# Patient Record
Sex: Female | Born: 1970 | Race: White | Hispanic: No | Marital: Married | Smoking: Current every day smoker
Health system: Southern US, Community
[De-identification: ages and names within clinical notes are randomized; demographics above are authoritative.]

## PROBLEM LIST (undated history)

## (undated) DIAGNOSIS — Z923 Personal history of irradiation: Secondary | ICD-10-CM

## (undated) DIAGNOSIS — T7840XA Allergy, unspecified, initial encounter: Secondary | ICD-10-CM

## (undated) DIAGNOSIS — I10 Essential (primary) hypertension: Secondary | ICD-10-CM

## (undated) DIAGNOSIS — F419 Anxiety disorder, unspecified: Secondary | ICD-10-CM

## (undated) HISTORY — DX: Anxiety disorder, unspecified: F41.9

## (undated) HISTORY — DX: Allergy, unspecified, initial encounter: T78.40XA

## (undated) HISTORY — PX: WISDOM TOOTH EXTRACTION: SHX21

## (undated) HISTORY — PX: COLONOSCOPY: SHX174

---

## 2005-10-22 ENCOUNTER — Ambulatory Visit: Payer: Self-pay | Admitting: Pediatrics

## 2007-03-25 ENCOUNTER — Ambulatory Visit: Payer: Self-pay | Admitting: Family Medicine

## 2008-10-18 ENCOUNTER — Ambulatory Visit: Payer: Self-pay | Admitting: Family Medicine

## 2008-11-02 ENCOUNTER — Ambulatory Visit: Payer: Self-pay | Admitting: Family Medicine

## 2009-04-18 ENCOUNTER — Ambulatory Visit: Payer: Self-pay | Admitting: Family Medicine

## 2011-01-02 ENCOUNTER — Ambulatory Visit: Payer: Self-pay | Admitting: Family Medicine

## 2013-08-10 ENCOUNTER — Ambulatory Visit: Payer: Self-pay | Admitting: Physician Assistant

## 2013-09-10 ENCOUNTER — Ambulatory Visit: Payer: Self-pay | Admitting: Gastroenterology

## 2014-08-09 DIAGNOSIS — E663 Overweight: Secondary | ICD-10-CM | POA: Insufficient documentation

## 2014-08-09 DIAGNOSIS — F419 Anxiety disorder, unspecified: Secondary | ICD-10-CM | POA: Insufficient documentation

## 2016-01-15 ENCOUNTER — Other Ambulatory Visit: Payer: Self-pay | Admitting: Adult Health

## 2016-01-15 DIAGNOSIS — Z1231 Encounter for screening mammogram for malignant neoplasm of breast: Secondary | ICD-10-CM

## 2016-02-08 ENCOUNTER — Ambulatory Visit: Payer: Self-pay

## 2018-01-21 DIAGNOSIS — J309 Allergic rhinitis, unspecified: Secondary | ICD-10-CM | POA: Insufficient documentation

## 2020-03-21 ENCOUNTER — Other Ambulatory Visit: Payer: Self-pay | Admitting: Student

## 2020-03-21 DIAGNOSIS — Z1231 Encounter for screening mammogram for malignant neoplasm of breast: Secondary | ICD-10-CM

## 2020-03-24 ENCOUNTER — Other Ambulatory Visit: Payer: Self-pay

## 2020-03-24 ENCOUNTER — Ambulatory Visit
Admission: RE | Admit: 2020-03-24 | Discharge: 2020-03-24 | Disposition: A | Discharge status: Home / Self Care | Payer: 59 | Source: Ambulatory Visit | Attending: Student | Admitting: Student

## 2020-03-24 DIAGNOSIS — Z1231 Encounter for screening mammogram for malignant neoplasm of breast: Secondary | ICD-10-CM | POA: Insufficient documentation

## 2020-03-28 ENCOUNTER — Inpatient Hospital Stay
Admission: RE | Admit: 2020-03-28 | Discharge: 2020-03-28 | Disposition: A | Discharge status: Home / Self Care | Payer: Self-pay | Source: Ambulatory Visit | Attending: *Deleted | Admitting: *Deleted

## 2020-03-28 ENCOUNTER — Other Ambulatory Visit: Payer: Self-pay | Admitting: *Deleted

## 2020-03-28 DIAGNOSIS — Z1231 Encounter for screening mammogram for malignant neoplasm of breast: Secondary | ICD-10-CM

## 2021-03-09 ENCOUNTER — Other Ambulatory Visit: Payer: Self-pay | Admitting: Student

## 2021-03-09 DIAGNOSIS — Z1231 Encounter for screening mammogram for malignant neoplasm of breast: Secondary | ICD-10-CM

## 2021-03-27 ENCOUNTER — Other Ambulatory Visit: Payer: Self-pay

## 2021-03-27 ENCOUNTER — Ambulatory Visit
Admission: RE | Admit: 2021-03-27 | Discharge: 2021-03-27 | Disposition: A | Discharge status: Home / Self Care | Payer: BC Managed Care – PPO | Source: Ambulatory Visit | Attending: Student | Admitting: Student

## 2021-03-27 DIAGNOSIS — Z1231 Encounter for screening mammogram for malignant neoplasm of breast: Secondary | ICD-10-CM | POA: Insufficient documentation

## 2022-03-08 ENCOUNTER — Other Ambulatory Visit: Payer: Self-pay | Admitting: Student

## 2022-03-08 DIAGNOSIS — Z1231 Encounter for screening mammogram for malignant neoplasm of breast: Secondary | ICD-10-CM

## 2022-03-29 ENCOUNTER — Ambulatory Visit
Admission: RE | Admit: 2022-03-29 | Discharge: 2022-03-29 | Disposition: A | Discharge status: Home / Self Care | Payer: BC Managed Care – PPO | Source: Ambulatory Visit | Attending: Student | Admitting: Student

## 2022-03-29 DIAGNOSIS — Z1231 Encounter for screening mammogram for malignant neoplasm of breast: Secondary | ICD-10-CM | POA: Insufficient documentation

## 2022-04-02 ENCOUNTER — Other Ambulatory Visit: Payer: Self-pay | Admitting: Student

## 2022-04-02 DIAGNOSIS — R928 Other abnormal and inconclusive findings on diagnostic imaging of breast: Secondary | ICD-10-CM

## 2022-04-02 DIAGNOSIS — N63 Unspecified lump in unspecified breast: Secondary | ICD-10-CM

## 2022-04-23 ENCOUNTER — Ambulatory Visit
Admission: RE | Admit: 2022-04-23 | Discharge: 2022-04-23 | Disposition: A | Discharge status: Home / Self Care | Payer: BC Managed Care – PPO | Source: Ambulatory Visit | Attending: Student | Admitting: Student

## 2022-04-23 DIAGNOSIS — N63 Unspecified lump in unspecified breast: Secondary | ICD-10-CM

## 2022-04-23 DIAGNOSIS — R928 Other abnormal and inconclusive findings on diagnostic imaging of breast: Secondary | ICD-10-CM

## 2022-04-25 ENCOUNTER — Other Ambulatory Visit: Payer: Self-pay | Admitting: Student

## 2022-04-25 DIAGNOSIS — R928 Other abnormal and inconclusive findings on diagnostic imaging of breast: Secondary | ICD-10-CM

## 2022-04-25 DIAGNOSIS — N63 Unspecified lump in unspecified breast: Secondary | ICD-10-CM

## 2022-05-02 ENCOUNTER — Ambulatory Visit
Admission: RE | Admit: 2022-05-02 | Discharge: 2022-05-02 | Disposition: A | Discharge status: Home / Self Care | Payer: BC Managed Care – PPO | Source: Ambulatory Visit | Attending: Student | Admitting: Student

## 2022-05-02 DIAGNOSIS — N63 Unspecified lump in unspecified breast: Secondary | ICD-10-CM

## 2022-05-02 DIAGNOSIS — R928 Other abnormal and inconclusive findings on diagnostic imaging of breast: Secondary | ICD-10-CM

## 2022-05-02 HISTORY — PX: BREAST BIOPSY: SHX20

## 2022-05-02 MED ORDER — LIDOCAINE HCL (PF) 1 % IJ SOLN
3.0000 mL | Freq: Once | INTRAMUSCULAR | Status: AC
  Administered 2022-05-02: 3 mL

## 2022-05-02 MED ORDER — LIDOCAINE-EPINEPHRINE 1 %-1:100000 IJ SOLN
8.0000 mL | Freq: Once | INTRAMUSCULAR | Status: AC
  Administered 2022-05-02: 8 mL

## 2022-05-03 ENCOUNTER — Encounter: Payer: Self-pay | Admitting: *Deleted

## 2022-05-03 NOTE — Progress Notes (Signed)
Received referral for newly diagnosed breast cancer from [facility].  Navigation initiated.  Gave Ms. Rhonda Charles names of surgeons and med oncs, she is going to decide who she would like to see and give me a call on Monday.

## 2022-05-06 ENCOUNTER — Encounter: Payer: Self-pay | Admitting: *Deleted

## 2022-05-06 DIAGNOSIS — C50919 Malignant neoplasm of unspecified site of unspecified female breast: Secondary | ICD-10-CM

## 2022-05-06 LAB — SURGICAL PATHOLOGY

## 2022-05-06 NOTE — Progress Notes (Signed)
Rhonda Charles would like to see Dr. Grayland Ormond and Dr. Juliet Rude at [facility] breast clinic for surgery.   Referral faxed to [facility].   She will see Dr. Grayland Ormond tomorrow at 1:30.

## 2022-05-07 ENCOUNTER — Inpatient Hospital Stay: Payer: BC Managed Care – PPO

## 2022-05-07 ENCOUNTER — Inpatient Hospital Stay: Payer: BC Managed Care – PPO | Attending: Oncology | Admitting: Oncology

## 2022-05-07 ENCOUNTER — Encounter: Payer: Self-pay | Admitting: *Deleted

## 2022-05-07 ENCOUNTER — Encounter: Payer: Self-pay | Admitting: Oncology

## 2022-05-07 VITALS — BP 177/88 | HR 90 | Temp 98.6°F | Resp 16 | Ht 63.0 in | Wt 168.0 lb

## 2022-05-07 DIAGNOSIS — Z79899 Other long term (current) drug therapy: Secondary | ICD-10-CM | POA: Diagnosis not present

## 2022-05-07 DIAGNOSIS — Z170 Estrogen receptor positive status [ER+]: Secondary | ICD-10-CM | POA: Diagnosis not present

## 2022-05-07 DIAGNOSIS — Z803 Family history of malignant neoplasm of breast: Secondary | ICD-10-CM | POA: Diagnosis not present

## 2022-05-07 DIAGNOSIS — F17210 Nicotine dependence, cigarettes, uncomplicated: Secondary | ICD-10-CM | POA: Diagnosis not present

## 2022-05-07 DIAGNOSIS — C50411 Malignant neoplasm of upper-outer quadrant of right female breast: Secondary | ICD-10-CM | POA: Insufficient documentation

## 2022-05-07 DIAGNOSIS — C50911 Malignant neoplasm of unspecified site of right female breast: Secondary | ICD-10-CM | POA: Insufficient documentation

## 2022-05-07 NOTE — Progress Notes (Signed)
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  INTERVAL HISTORY: Patient is a 52 year old female who was noted to have an abnormality on her right breast during routine screening mammogram.  Subsequent ultrasound and biopsy revealed the above-stated malignancy.  She currently feels well and is asymptomatic.  She has no neurologic complaints.  She denies any recent fevers or illnesses.  She has a good appetite and denies weight loss.  She has no chest pain, shortness of breath, cough, or hemoptysis.  She denies any nausea, vomiting, constipation, or diarrhea.  She has no urinary complaints.  Patient feels at her baseline and offers no specific complaints today.  REVIEW OF SYSTEMS:   Review of Systems  Constitutional: Negative.  Negative for fever, malaise/fatigue and weight loss.  Respiratory: Negative.  Negative for cough, hemoptysis and shortness of breath.   Cardiovascular: Negative.  Negative for chest pain and leg swelling.  Gastrointestinal: Negative.  Negative for abdominal pain.  Genitourinary: Negative.  Negative for dysuria.  Musculoskeletal: Negative.  Negative for back pain.  Skin: Negative.  Negative for rash.  Neurological: Negative.  Negative for dizziness, focal weakness, weakness and headaches.  Psychiatric/Behavioral: Negative.  The patient is not nervous/anxious.     As per HPI. Otherwise, a complete review of systems is negative.  PAST MEDICAL HISTORY: Past Medical History:  Diagnosis Date   Allergy    Seasonal   Anxiety     PAST SURGICAL HISTORY: Past Surgical History:  Procedure Laterality Date   BREAST BIOPSY Right 05/02/2022    US biopsy/ ribbon clip/ path pending   BREAST BIOPSY Right 05/02/2022   [place] RT BREAST BX W LOC DEV 1ST LESION IMG BX SPEC US GUIDE 05/02/2022 [facility]   WISDOM TOOTH EXTRACTION Bilateral     FAMILY HISTORY: Family History  Problem Relation Age of Onset   Breast cancer Mother 80   Anxiety disorder Mother    Arthritis Mother    Cancer Mother    Diabetes Mother    Breast cancer Maternal Grandmother 93   Cancer Maternal Grandmother    Varicose Veins Maternal Grandmother     ADVANCED DIRECTIVES (Y/N):  N  HEALTH MAINTENANCE: Social History   Tobacco Use   Smoking status: Every Day    Packs/day: 0.00    Years: 15.00    Total pack years: 0.00    Types: Cigarettes   Tobacco comments:    In a program  Substance Use Topics   Alcohol use: Not Currently    Alcohol/week: 2.0 standard drinks of alcohol    Types: 2 Cans of beer per week   Drug use: Never     Colonoscopy:  PAP:  Bone density:  Lipid panel:  No Known Allergies  Current Outpatient Medications  Medication Sig Dispense Refill   escitalopram (LEXAPRO) 10 MG tablet Take 10 mg by mouth daily.     montelukast (SINGULAIR) 10 MG tablet Take 1 tablet by mouth at bedtime.     Norgestimate-Ethinyl Estradiol Triphasic 0.18/0.215/0.25 MG-35 MCG tablet Take 1 tablet by mouth daily.     No current facility-administered medications for this visit.    OBJECTIVE: Vitals:   05/07/22 1338  BP: (!) 177/88  Pulse: 90  Resp: 16  Temp: 98.6 F (37 C)  SpO2: 98%     Body mass index is 29.76 kg/m.    ECOG FS:0 - Asymptomatic  General: Well-developed, well-nourished, no acute distress. Eyes: Pink conjunctiva, anicteric sclera. HEENT: Normocephalic, moist mucous membranes. Breast: Exam deferred today. Lungs: No audible wheezing or coughing. Heart: Regular rate and rhythm. Abdomen: Soft, nontender, no obvious distention. Musculoskeletal: No edema, cyanosis, or clubbing. Neuro: Alert, answering all questions  appropriately. Cranial nerves grossly intact. Skin: No rashes or petechiae noted. Psych: Normal affect. Lymphatics: No cervical, calvicular, axillary or inguinal LAD.   LAB RESULTS:  No results found for: "NA", "K", "CL", "CO2", "GLUCOSE", "BUN", "CREATININE", "CALCIUM", "PROT", "ALBUMIN", "AST", "ALT", "ALKPHOS", "BILITOT", "GFRNONAA", "GFRAA"  No results found for: "WBC", "NEUTROABS", "HGB", "HCT", "MCV", "PLT"   STUDIES: [place] RT BREAST BX W LOC DEV 1ST LESION IMG BX SPEC US GUIDE  Addendum Date: 05/03/2022   ADDENDUM REPORT: 05/03/2022 14:51 ADDENDUM: PATHOLOGY revealed: A. BREAST, RIGHT, 11:00, 8 CM FROM NIPPLE; CORE BIOPSIES: - INVASIVE MAMMARY CARCINOMA, NO SPECIAL TYPE. 5 mm in this sample. Grade 1 (of 3). 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 Lymphovascular invasion: Not identified. Pathology results are CONCORDANT with imaging findings, per Dr. Beryle Flock. Recommend surgical and oncologic consultation. Pathology results and recommendations were discussed with patient via telephone on 05/03/2022. Patient reported biopsy site doing well with no adverse symptoms, and only slight tenderness at the site. Post biopsy care instructions were reviewed, questions were answered and my direct phone number was provided. Patient was instructed to call [facility] for any additional questions or concerns related to biopsy site. RECOMMENDATIONS: Surgical and oncological consultation. Request for surgical and oncological consultation relayed to Casper Harrison RN at [facility] by Electa Sniff RN on 05/03/2022. Pathology results reported by Electa Sniff RN on 05/03/2022. Electronically Signed   By: Beryle Flock M.D.   On: 05/03/2022 14:51   Result Date: 05/03/2022 CLINICAL DATA:  Suspicious mass in the right breast 11 o'clock position EXAM: ULTRASOUND GUIDED RIGHT BREAST CORE NEEDLE BIOPSY COMPARISON:  Previous exam(s). PROCEDURE: I met with the patient  and we discussed the procedure of ultrasound-guided biopsy, including benefits and alternatives. We discussed the high likelihood of a successful procedure. We discussed the risks of the procedure, including infection, bleeding, tissue injury, clip migration, and inadequate sampling. Informed written consent was given. The usual time-out protocol was performed immediately prior to the procedure. Lesion quadrant: Upper outer quadrant Using sterile technique and 1% Lidocaine as local anesthetic, under direct ultrasound visualization, a 14 gauge spring-loaded device was used to perform biopsy of a mass in the right breast 11 o'clock position using an inferior approach. At the conclusion of the procedure, a ribbon shaped tissue marker clip was deployed into the biopsy cavity. Follow up 2 view mammogram was performed and dictated separately. IMPRESSION: Ultrasound guided biopsy of a mass in the right breast 11 o'clock position. No apparent complications. Electronically Signed: By: Beryle Flock M.D. On: 05/02/2022 13:51   MM CLIP PLACEMENT RIGHT  Result Date: 05/02/2022 CLINICAL DATA:  Post procedure mammogram EXAM: 3D DIAGNOSTIC RIGHT MAMMOGRAM POST ULTRASOUND BIOPSY COMPARISON:  Previous exam(s). FINDINGS: 3D Mammographic images were obtained following ultrasound guided biopsy of a mass in the right breast 11 o'clock position. The biopsy marking clip is in expected position at the site of biopsy, along the medial/posterior margin of the mass. IMPRESSION: Appropriate positioning of the ribbon shaped biopsy marking clip at  the site of biopsy in the right breast 11 o'clock position. Final Assessment: Post Procedure Mammograms for Marker Placement Electronically Signed   By: Beryle Flock M.D.   On: 05/02/2022 13:57  MM DIAG BREAST TOMO UNI RIGHT  Result Date: 04/23/2022 CLINICAL DATA:  Screening recall for a possible right breast mass. The patient has family history of breast cancer in her mother and maternal  grandmother. EXAM: DIGITAL DIAGNOSTIC UNILATERAL RIGHT MAMMOGRAM WITH TOMOSYNTHESIS; ULTRASOUND RIGHT BREAST LIMITED TECHNIQUE: Right digital diagnostic mammography and breast tomosynthesis was performed.; Targeted ultrasound examination of the right breast was performed COMPARISON:  Previous exam(s). ACR Breast Density Category b: There are scattered areas of fibroglandular density. FINDINGS: Spot compression tomosynthesis images through the superior posterior right breast demonstrates a small irregular mass with spiculated margins. Ultrasound of the right breast at 11 o'clock, 8 cm from the nipple demonstrates a hypoechoic oval mass with indistinct margins measuring 5 x 4 x 5 mm. Ultrasound of the right axilla demonstrates multiple normal-appearing lymph nodes. IMPRESSION: 1. There is a suspicious 5 mm mass in the right breast at 11 o'clock. 2.  No evidence of right axillary lymphadenopathy. RECOMMENDATION: 1. Ultrasound guided biopsy is recommended for the right breast mass. We will contact the patient to schedule the procedure at her earliest convenience. 2. Genetics assessment suggested to determine the patient's lifetime risk of breast cancer given her family history, if this has not already been performed. Per American Cancer Society guidelines, if the patient has a calculated lifetime risk of developing breast cancer of greater than 20%, bilateral breast MRI would be recommended. I have discussed the findings and recommendations with the patient. If applicable, a reminder letter will be sent to the patient regarding the next appointment. BI-RADS CATEGORY  5: Highly suggestive of malignancy. Electronically Signed   By: Ammie Ferrier M.D.   On: 04/23/2022 12:05  US BREAST LTD UNI RIGHT INC AXILLA  Result Date: 04/23/2022 CLINICAL DATA:  Screening recall for a possible right breast mass. The patient has family history of breast cancer in her mother and maternal grandmother. EXAM: DIGITAL DIAGNOSTIC  UNILATERAL RIGHT MAMMOGRAM WITH TOMOSYNTHESIS; ULTRASOUND RIGHT BREAST LIMITED TECHNIQUE: Right digital diagnostic mammography and breast tomosynthesis was performed.; Targeted ultrasound examination of the right breast was performed COMPARISON:  Previous exam(s). ACR Breast Density Category b: There are scattered areas of fibroglandular density. FINDINGS: Spot compression tomosynthesis images through the superior posterior right breast demonstrates a small irregular mass with spiculated margins. Ultrasound of the right breast at 11 o'clock, 8 cm from the nipple demonstrates a hypoechoic oval mass with indistinct margins measuring 5 x 4 x 5 mm. Ultrasound of the right axilla demonstrates multiple normal-appearing lymph nodes. IMPRESSION: 1. There is a suspicious 5 mm mass in the right breast at 11 o'clock. 2.  No evidence of right axillary lymphadenopathy. RECOMMENDATION: 1. Ultrasound guided biopsy is recommended for the right breast mass. We will contact the patient to schedule the procedure at her earliest convenience. 2. Genetics assessment suggested to determine the patient's lifetime risk of breast cancer given her family history, if this has not already been performed. Per American Cancer Society guidelines, if the patient has a calculated lifetime risk of developing breast cancer of greater than 20%, bilateral breast MRI would be recommended. I have discussed the findings and recommendations with the patient. If applicable, a reminder letter will be sent to the patient regarding the next appointment. BI-RADS CATEGORY  5: Highly suggestive of malignancy. Electronically Signed  By: Ammie Ferrier M.D.   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  Given the small size of patient's  malignancy and stage, she will benefit from lumpectomy as first-line treatment.  Patient is considering having her surgery done at [facility].  She will unlikely require chemotherapy or even an Oncotype score, but will await final pathology to make this determination.  She will likely need adjuvant XRT.  Finally, patient will benefit from 5 years of either tamoxifen or letrozole at the conclusion of all her treatments.  Return to clinic 2 weeks after her surgery to discuss her final pathology results and additional treatment planning.  I spent a total of 60 minutes reviewing chart data, face-to-face evaluation with the patient, counseling and coordination of care as detailed above.   Patient expressed understanding and was in agreement with this plan. She also understands that She can call clinic at any time with any questions, concerns, or complaints.    Cancer Staging  Invasive ductal carcinoma of right breast [facility]) Staging form: Breast, AJCC 8th Edition - Clinical stage from 05/07/2022: Stage IA (cT1a, cN0, cM0, G1, ER+, PR+, HER2-) - Signed by Lloyd Huger, MD on 05/07/2022 Stage prefix: Initial diagnosis Histologic grading system: 3 grade system   Lloyd Huger, MD   05/07/2022 3:35 PM

## 2022-05-07 NOTE — Progress Notes (Signed)
Met with patient and husband during initial med onc appt. With Dr. Grayland Ormond. Reviewed Breast Cancer treatment handbook.   Care plan summary given to patient.   Reviewed outreach programs and cancer center services.

## 2022-05-17 ENCOUNTER — Encounter: Payer: Self-pay | Admitting: *Deleted

## 2022-05-17 NOTE — Progress Notes (Signed)
Rhonda Charles called to ask about her birth control pills.   Per Dr. Grayland Ormond she can switch to progestin only pill.   She will call her PCP and have a Rx sent in.

## 2022-05-22 ENCOUNTER — Other Ambulatory Visit: Payer: Self-pay | Admitting: General Surgery

## 2022-05-22 DIAGNOSIS — C50911 Malignant neoplasm of unspecified site of right female breast: Secondary | ICD-10-CM

## 2022-05-23 ENCOUNTER — Ambulatory Visit: Payer: BC Managed Care – PPO | Attending: General Surgery | Admitting: Physical Therapy

## 2022-05-23 ENCOUNTER — Encounter: Payer: Self-pay | Admitting: Physical Therapy

## 2022-05-23 ENCOUNTER — Other Ambulatory Visit: Payer: Self-pay

## 2022-05-23 ENCOUNTER — Ambulatory Visit: Payer: BC Managed Care – PPO | Admitting: Rehabilitation

## 2022-05-23 DIAGNOSIS — C50411 Malignant neoplasm of upper-outer quadrant of right female breast: Secondary | ICD-10-CM | POA: Insufficient documentation

## 2022-05-23 DIAGNOSIS — R293 Abnormal posture: Secondary | ICD-10-CM | POA: Diagnosis present

## 2022-05-23 DIAGNOSIS — Z170 Estrogen receptor positive status [ER+]: Secondary | ICD-10-CM | POA: Diagnosis present

## 2022-05-23 NOTE — Therapy (Signed)
OUTPATIENT PHYSICAL THERAPY BREAST CANCER BASELINE EVALUATION   Patient Name: Rhonda Charles MRN: [id number] DOB:09/24/1970, 52 y.o., female Today's Date: 05/23/2022  END OF SESSION:  PT End of Session - 05/23/22 1548     Visit Number 1    Number of Visits 2    Date for PT Re-Evaluation 07/04/22    PT Start Time 1505    PT Stop Time 1545    PT Time Calculation (min) 40 min    Activity Tolerance Patient tolerated treatment well    Behavior During Therapy [facility] for tasks assessed/performed             Past Medical History:  Diagnosis Date   Allergy    Seasonal   Anxiety    Past Surgical History:  Procedure Laterality Date   BREAST BIOPSY Right 05/02/2022   US biopsy/ ribbon clip/ path pending   BREAST BIOPSY Right 05/02/2022   [place] RT BREAST BX W LOC DEV 1ST LESION IMG BX Julian US GUIDE 05/02/2022 [facility]   WISDOM TOOTH EXTRACTION Bilateral    Patient Active Problem List   Diagnosis Date Noted   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[place]) 05/07/2022   Allergic rhinitis 01/21/2018   Anxiety 08/09/2014   Overweight (BMI 25.0-29.9) 08/09/2014    PCP: Wayland Denis PA-C  REFERRING PROVIDER: Rolm Bookbinder, MD  REFERRING DIAG: [phone] (ICD-10-CM) - Malignant neoplasm of upper-outer quadrant of right female breast ([place])  THERAPY DIAG:  Abnormal posture  Malignant neoplasm of upper-outer quadrant of right breast in female, estrogen receptor positive ([place])  Rationale for Evaluation and Treatment: Rehabilitation  ONSET DATE: 05/02/22  SUBJECTIVE:                                                                                                                                                                                           SUBJECTIVE STATEMENT: Patient reports she is here today to be seen by her medical team for her newly diagnosed right breast cancer.   PERTINENT HISTORY:  Patient was diagnosed on 05/02/22 with right grade 1. It measures 5 x  4 x 5 mm and is located in the upper-outer quadrant. It is ER/PR+, Her2-. Ki67 not completed. Marland Kitchen   PATIENT GOALS:   reduce lymphedema risk and learn post op HEP.   PAIN:  Are you having pain? No  PRECAUTIONS: Active CA   HAND DOMINANCE: left  WEIGHT BEARING RESTRICTIONS: No  FALLS:  Has patient fallen in last 6 months? No  LIVING ENVIRONMENT: Patient lives with: husband and son (74) Lives in: House/apartment Has following equipment at home: None  OCCUPATION: full time - office  manager  LEISURE: she walks 30 min 3x/wk, used to go to the gym but has not been in 6 months  PRIOR LEVEL OF FUNCTION: Independent   OBJECTIVE:  COGNITION: Overall cognitive status: Within functional limits for tasks assessed    POSTURE:  Forward head and rounded shoulders posture  UPPER EXTREMITY AROM/PROM:  A/PROM RIGHT   eval   Shoulder extension 63  Shoulder flexion 168  Shoulder abduction 173  Shoulder internal rotation 38  Shoulder external rotation 83    (Blank rows = not tested)  A/PROM LEFT   eval  Shoulder extension 54  Shoulder flexion 166  Shoulder abduction 175  Shoulder internal rotation 38  Shoulder external rotation 82    (Blank rows = not tested)  CERVICAL AROM: All within normal limits:    Percent limited  Flexion WFL  Extension WFL  Right lateral flexion WFL  Left lateral flexion WFL  Right rotation WFL  Left rotation WFL    UPPER EXTREMITY STRENGTH: 5/5  LYMPHEDEMA ASSESSMENTS:   LANDMARK RIGHT   eval  10 cm proximal to olecranon process 31  Olecranon process 24.5  10 cm proximal to ulnar styloid process 21  Just proximal to ulnar styloid process 14.5  Across hand at thumb web space 19  At base of 2nd digit 5.6  (Blank rows = not tested)  LANDMARK LEFT   eval  10 cm proximal to olecranon process 31.1  Olecranon process 24.4  10 cm proximal to ulnar styloid process 21.1  Just proximal to ulnar styloid process 14  Across hand at thumb web  space 19.3  At base of 2nd digit 5.5  (Blank rows = not tested)  L-DEX LYMPHEDEMA SCREENING:  The patient was assessed using the L-Dex machine today to produce a lymphedema index baseline score. The patient will be reassessed on a regular basis (typically every 3 months) to obtain new L-Dex scores. If the score is > 6.5 points away from his/her baseline score indicating onset of subclinical lymphedema, it will be recommended to wear a compression garment for 4 weeks, 12 hours per day and then be reassessed. If the score continues to be > 6.5 points from baseline at reassessment, we will initiate lymphedema treatment. Assessing in this manner has a 95% rate of preventing clinically significant lymphedema.   L-DEX FLOWSHEETS - 05/23/22 1500       L-DEX LYMPHEDEMA SCREENING   Measurement Type Unilateral    L-DEX MEASUREMENT EXTREMITY Upper Extremity    POSITION  Standing    DOMINANT SIDE Left    At Risk Side Right    BASELINE SCORE (UNILATERAL) 9.4             QUICK DASH SURVEY:  Katina Dung - 05/23/22 0001     Open a tight or new jar Mild difficulty    Do heavy household chores (wash walls, wash floors) No difficulty    Carry a shopping bag or briefcase No difficulty    Wash your back No difficulty    Use a knife to cut food No difficulty    Recreational activities in which you take some force or impact through your arm, shoulder, or hand (golf, hammering, tennis) Mild difficulty    During the past week, to what extent has your arm, shoulder or hand problem interfered with your normal social activities with family, friends, neighbors, or groups? Not at all    During the past week, to what extent has your arm, shoulder or hand problem  limited your work or other regular daily activities Not at all    Arm, shoulder, or hand pain. None    Tingling (pins and needles) in your arm, shoulder, or hand None    Difficulty Sleeping No difficulty    DASH Score 4.55 %               PATIENT EDUCATION:  Education details: Lymphedema risk reduction and post op shoulder/posture HEP Person educated: Patient Education method: Explanation, Demonstration, Handout Education comprehension: Patient verbalized understanding and returned demonstration  HOME EXERCISE PROGRAM: Patient was instructed today in a home exercise program today for post op shoulder range of motion. These included active assist shoulder flexion in sitting, scapular retraction, wall walking with shoulder abduction, and hands behind head external rotation.  She was encouraged to do these twice a day, holding 3 seconds and repeating 5 times when permitted by her physician.   ASSESSMENT:  CLINICAL IMPRESSION: Pt presents to PT with recently diagnosed R breast cancer. She is planning to have a R lumpectomy and SLNB that has not been scheduled yet. She will also require radiation. Her shoulder ROM is WFL at baseline. She will benefit from a post op PT reassessment to determine needs and from L-Dex screens every 3 months for 2 years to detect subclinical lymphedema.  Pt will benefit from skilled therapeutic intervention to improve on the following deficits: Decreased knowledge of precautions, impaired UE functional use, pain, decreased ROM, postural dysfunction.   PT treatment/interventions: ADL/self-care home management, pt/family education, therapeutic exercise  REHAB POTENTIAL: Good  CLINICAL DECISION MAKING: Stable/uncomplicated  EVALUATION COMPLEXITY: Low   GOALS: Goals reviewed with patient? YES  LONG TERM GOALS: (STG=LTG)    Name Target Date Goal status  1 Pt will be able to verbalize understanding of pertinent lymphedema risk reduction practices relevant to her dx specifically related to skin care.  Baseline:  No knowledge 05/23/2022 Achieved at eval  2 Pt will be able to return demo and/or verbalize understanding of the post op HEP related to regaining shoulder ROM. Baseline:  No knowledge  05/23/2022 Achieved at eval  3 Pt will be able to verbalize understanding of the importance of attending the post op After Breast CA Class for further lymphedema risk reduction education and therapeutic exercise.  Baseline:  No knowledge 05/23/2022 Achieved at eval  4 Pt will demo she has regained full shoulder ROM and function post operatively compared to baselines.  Baseline: See objective measurements taken today. 07/04/22 NEW    PLAN:  PT FREQUENCY/DURATION: EVAL and 1 follow up appointment.   PLAN FOR NEXT SESSION: will reassess 3-4 weeks post op to determine needs.   Patient will follow up at outpatient cancer rehab 3-4 weeks following surgery.  If the patient requires physical therapy at that time, a specific plan will be dictated and sent to the referring physician for approval. The patient was educated today on appropriate basic range of motion exercises to begin post operatively and the importance of attending the After Breast Cancer class following surgery.  Patient was educated today on lymphedema risk reduction practices as it pertains to recommendations that will benefit the patient immediately following surgery.  She verbalized good understanding.    Physical Therapy Information for After Breast Cancer Surgery/Treatment:  Lymphedema is a swelling condition that you may be at risk for in your arm if you have lymph nodes removed from the armpit area.  After a sentinel node biopsy, the risk is approximately 5-9% and is higher  after an axillary node dissection.  There is treatment available for this condition and it is not life-threatening.  Contact your physician or physical therapist with concerns. You may begin the 4 shoulder/posture exercises (see additional sheet) when permitted by your physician (typically a week after surgery).  If you have drains, you may need to wait until those are removed before beginning range of motion exercises.  A general recommendation is to not lift your  arms above shoulder height until drains are removed.  These exercises should be done to your tolerance and gently.  This is not a "no pain/no gain" type of recovery so listen to your body and stretch into the range of motion that you can tolerate, stopping if you have pain.  If you are having immediate reconstruction, ask your plastic surgeon about doing exercises as he or she may want you to wait. We encourage you to attend the free one time ABC (After Breast Cancer) class offered by [facility].  You will learn information related to lymphedema risk, prevention and treatment and additional exercises to regain mobility following surgery.  You can call [phone] for more information.  This is offered the 1st and 3rd Monday of each month.  You only attend the class one time. While undergoing any medical procedure or treatment, try to avoid blood pressure being taken or needle sticks from occurring on the arm on the side of cancer.   This recommendation begins after surgery and continues for the rest of your life.  This may help reduce your risk of getting lymphedema (swelling in your arm). An excellent resource for those seeking information on lymphedema is the National Lymphedema Network's web site. It can be accessed at [facility].[employer] If you notice swelling in your hand, arm or breast at any time following surgery (even if it is many years from now), please contact your doctor or physical therapist to discuss this.  Lymphedema can be treated at any time but it is easier for you if it is treated early on.  If you feel like your shoulder motion is not returning to normal in a reasonable amount of time, please contact your surgeon or physical therapist.  [facility] [phone]. [address], [address], [place] [address]  ABC CLASS After Breast Cancer Class  After Breast Cancer Class is a specially designed exercise class to assist you in a  safe recover after having breast cancer surgery.  In this class you will learn how to get back to full function whether your drains were just removed or if you had surgery a month ago.  This one-time class is held the 1st and 3rd Monday of every month from 11:00 a.m. until 12:00 noon virtually.  This class is FREE and space is limited. For more information or to register for the next available class, call [phone].  Class Goals  Understand specific stretches to improve the flexibility of you chest and shoulder. Learn ways to safely strengthen your upper body and improve your posture. Understand the warning signs of infection and why you may be at risk for an arm infection. Learn about Lymphedema and prevention.  ** You do not attend this class until after surgery.  Drains must be removed to participate  Patient was instructed today in a home exercise program today for post op shoulder range of motion. These included active assist shoulder flexion in sitting, scapular retraction, wall walking with shoulder abduction, and hands behind  head external rotation.  She was encouraged to do these twice a day, holding 3 seconds and repeating 5 times when permitted by her physician.    [facility], PT 05/23/2022, 3:50 PM

## 2022-05-24 ENCOUNTER — Encounter: Payer: Self-pay | Admitting: *Deleted

## 2022-05-24 ENCOUNTER — Other Ambulatory Visit: Payer: Self-pay | Admitting: General Surgery

## 2022-05-24 DIAGNOSIS — C50911 Malignant neoplasm of unspecified site of right female breast: Secondary | ICD-10-CM

## 2022-05-24 NOTE — Progress Notes (Signed)
Ms. Ciresi lumpectomy is scheduled for 2/16.  She will see Dr. Grayland Ormond and Dr. Baruch Gouty on 3/5.  Appt. Details given to her.

## 2022-05-28 ENCOUNTER — Ambulatory Visit: Payer: BC Managed Care – PPO

## 2022-05-30 ENCOUNTER — Encounter (HOSPITAL_COMMUNITY): Payer: Self-pay

## 2022-05-30 NOTE — Progress Notes (Signed)
Surgical Instructions    Your procedure is scheduled on Friday, June 07, 2022.  Report to [facility] Main Entrance "A" at 5:30 A.M., then check in with the Admitting office.  Call this number if you have problems the morning of surgery:  [phone]   If you have any questions prior to your surgery date call [phone]: Open Monday-Friday 8am-4pm If you experience any cold or flu symptoms such as cough, fever, chills, shortness of breath, etc. between now and your scheduled surgery, please notify us at the above number     Remember:  Do not eat after midnight the night before your surgery  You may drink clear liquids until 4:30 the morning of your surgery.   Clear liquids allowed are: Water, Non-Citrus Juices (without pulp), Carbonated Beverages, Clear Tea, Black Coffee ONLY (NO MILK, CREAM OR POWDERED CREAMER of any kind), and Gatorade  Patient Instructions  The night before surgery:  No food after midnight. ONLY clear liquids after midnight  The day of surgery (if you do NOT have diabetes):  Drink ONE (1) Pre-Surgery Clear Ensure by 4:30 the morning of surgery. Drink in one sitting. Do not sip.  This drink was given to you during your hospital  pre-op appointment visit.  Nothing else to drink after completing the  Pre-Surgery Clear Ensure.          If you have questions, please contact your surgeon's office.    Take these medicines the morning of surgery with A SIP OF WATER:  cetirizine (ZYRTEC)  escitalopram (LEXAPRO)  propranolol (INDERAL)   As needed: acetaminophen (TYLENOL)  clonazePAM (KLONOPIN)  fluticasone (FLONASE)    As of today, STOP taking any Aspirin (unless otherwise instructed by your surgeon) Aleve, Naproxen, Ibuprofen, Motrin, Advil, Goody's, BC's, all herbal medications, fish oil, and all vitamins.           Do not wear jewelry or makeup. Do not wear lotions, powders, perfumes or deodorant. Do not shave 48 hours prior to surgery.   Do not  bring valuables to the hospital. Do not wear nail polish, gel polish, artificial nails, or any other type of covering on natural nails (fingers and toes) If you have artificial nails or gel coating that need to be removed by a nail salon, please have this removed prior to surgery. Artificial nails or gel coating may interfere with anesthesia's ability to adequately monitor your vital signs.  [facility] is not responsible for any belongings or valuables.    Do NOT Smoke (Tobacco/Vaping)  24 hours prior to your procedure  If you use a CPAP at night, you may bring your mask for your overnight stay.   Contacts, glasses, hearing aids, dentures or partials may not be worn into surgery, please bring cases for these belongings   For patients admitted to the hospital, discharge time will be determined by your treatment team.   Patients discharged the day of surgery will not be allowed to drive home, and someone needs to stay with them for 24 hours.   SURGICAL WAITING ROOM VISITATION Patients having surgery or a procedure may have no more than 2 support people in the waiting area - these visitors may rotate.   Children under the age of 108 must have an adult with them who is not the patient. If the patient needs to stay at the hospital during part of their recovery, the visitor guidelines for inpatient rooms apply. Pre-op nurse will coordinate an appropriate time for 1 support person to  accompany patient in pre-op.  This support person may not rotate.   Please refer to [email or web address] for the visitor guidelines for Inpatients (after your surgery is over and you are in a regular room).    Special instructions:    Oral Hygiene is also important to reduce your risk of infection.  Remember - BRUSH YOUR TEETH THE MORNING OF SURGERY WITH YOUR REGULAR TOOTHPASTE   [facility]- Preparing For Surgery  Before surgery, you can play an important role.  Because skin is not sterile, your skin needs to be as free of germs as possible. You can reduce the number of germs on your skin by washing with CHG (chlorahexidine gluconate) Soap before surgery.  CHG is an antiseptic cleaner which kills germs and bonds with the skin to continue killing germs even after washing.     Please do not use if you have an allergy to CHG or antibacterial soaps. If your skin becomes reddened/irritated stop using the CHG.  Do not shave (including legs and underarms) for at least 48 hours prior to first CHG shower. It is OK to shave your face.  Please follow these instructions carefully.     Shower the NIGHT BEFORE SURGERY and the MORNING OF SURGERY with CHG Soap.   If you chose to wash your hair, wash your hair first as usual with your normal shampoo. After you shampoo, rinse your hair and body thoroughly to remove the shampoo.  Then [employer] and genitals (private parts) with your normal soap and rinse thoroughly to remove soap.  After that Use CHG Soap as you would any other liquid soap. You can apply CHG directly to the skin and wash gently with a scrungie or a clean washcloth.   Apply the CHG Soap to your body ONLY FROM THE NECK DOWN.  Do not use on open wounds or open sores. Avoid contact with your eyes, ears, mouth and genitals (private parts). Wash Face and genitals (private parts)  with your normal soap.   Wash thoroughly, paying special attention to the area where your surgery will be performed.  Thoroughly rinse your body with warm water from the neck down.  DO NOT shower/wash with your normal soap after using and rinsing off the CHG Soap.  Pat yourself dry with a CLEAN TOWEL.  Wear CLEAN PAJAMAS to bed the night before surgery  Place CLEAN SHEETS on your bed the night before your surgery  DO NOT SLEEP WITH PETS.   Day of Surgery:  Take a shower with CHG soap. Wear Clean/Comfortable clothing the morning of surgery Do not apply any  deodorants/lotions.   Remember to brush your teeth WITH YOUR REGULAR TOOTHPASTE.    If you received a COVID test during your pre-op visit, it is requested that you wear a mask when out in public, stay away from anyone that may not be feeling well, and notify your surgeon if you develop symptoms. If you have been in contact with anyone that has tested positive in the last 10 days, please notify your surgeon.    Please read over the following fact sheets that you were given.

## 2022-05-31 ENCOUNTER — Encounter (HOSPITAL_COMMUNITY)
Admission: RE | Admit: 2022-05-31 | Discharge: 2022-05-31 | Disposition: A | Discharge status: Home / Self Care | Payer: BC Managed Care – PPO | Source: Ambulatory Visit | Attending: General Surgery | Admitting: General Surgery

## 2022-05-31 ENCOUNTER — Encounter (HOSPITAL_COMMUNITY): Payer: Self-pay

## 2022-05-31 ENCOUNTER — Other Ambulatory Visit: Payer: Self-pay

## 2022-05-31 ENCOUNTER — Other Ambulatory Visit (HOSPITAL_COMMUNITY): Payer: BC Managed Care – PPO

## 2022-05-31 VITALS — BP 143/76 | HR 79 | Temp 98.3°F | Resp 17 | Ht 63.0 in | Wt 165.0 lb

## 2022-05-31 DIAGNOSIS — Z01818 Encounter for other preprocedural examination: Secondary | ICD-10-CM | POA: Insufficient documentation

## 2022-05-31 HISTORY — DX: Essential (primary) hypertension: I10

## 2022-05-31 LAB — CBC
HCT: 46.6 % — ABNORMAL HIGH (ref 36.0–46.0)
Hemoglobin: 15.3 g/dL — ABNORMAL HIGH (ref 12.0–15.0)
MCH: 30.8 pg (ref 26.0–34.0)
MCHC: 32.8 g/dL (ref 30.0–36.0)
MCV: 94 fL (ref 80.0–100.0)
Platelets: 361 10*3/uL (ref 150–400)
RBC: 4.96 MIL/uL (ref 3.87–5.11)
RDW: 12.1 % (ref 11.5–15.5)
WBC: 9.3 10*3/uL (ref 4.0–10.5)
nRBC: 0 % (ref 0.0–0.2)

## 2022-05-31 LAB — BASIC METABOLIC PANEL
Anion gap: 10 (ref 5–15)
BUN: 7 mg/dL (ref 6–20)
CO2: 26 mmol/L (ref 22–32)
Calcium: 9.2 mg/dL (ref 8.9–10.3)
Chloride: 101 mmol/L (ref 98–111)
Creatinine, Ser: 0.8 mg/dL (ref 0.44–1.00)
GFR, Estimated: >60 mL/min (ref >60)
Glucose, Bld: 102 mg/dL — ABNORMAL HIGH (ref 70–99)
Potassium: 4 mmol/L (ref 3.5–5.1)
Sodium: 137 mmol/L (ref 135–145)

## 2022-05-31 LAB — POCT PREGNANCY, URINE: Preg Test, Ur: NEGATIVE

## 2022-05-31 NOTE — Progress Notes (Signed)
PCP - Wayland Denis, PA-C; [facility] in [place], [place] Cardiologist - Denies  PPM/ICD - Denies  Chest x-ray - n/a EKG - today 05/31/2022 Stress Test - denies ECHO - denies Cardiac Cath - denies  Sleep Study - Denies CPAP - Denies  Fasting Blood Sugar - non-diabetic  Last dose of GLP1 agonist-  non-diabetic  Blood Thinner Instructions: not-taking Aspirin Instructions: not-taking  ERAS Protcol -Yes PRE-SURGERY Ensure  COVID TEST- Denies   Anesthesia review: Yes, seed placement  Patient denies shortness of breath, fever, cough and chest pain at PAT appointment   All instructions explained to the patient, with a verbal understanding of the material. Patient agrees to go over the instructions while at home for a better understanding. Patient also instructed to self quarantine after being tested for COVID-19. The opportunity to ask questions was provided.

## 2022-06-04 ENCOUNTER — Ambulatory Visit
Admission: RE | Admit: 2022-06-04 | Discharge: 2022-06-04 | Disposition: A | Discharge status: Home / Self Care | Payer: BC Managed Care – PPO | Source: Ambulatory Visit | Attending: General Surgery | Admitting: General Surgery

## 2022-06-04 ENCOUNTER — Encounter: Payer: Self-pay | Admitting: Licensed Clinical Social Worker

## 2022-06-04 DIAGNOSIS — C50911 Malignant neoplasm of unspecified site of right female breast: Secondary | ICD-10-CM

## 2022-06-04 HISTORY — PX: BREAST BIOPSY: SHX20

## 2022-06-06 ENCOUNTER — Other Ambulatory Visit: Payer: BC Managed Care – PPO

## 2022-06-06 ENCOUNTER — Encounter: Payer: BC Managed Care – PPO | Admitting: Licensed Clinical Social Worker

## 2022-06-06 NOTE — Anesthesia Preprocedure Evaluation (Addendum)
Anesthesia Evaluation  Patient identified by MRN, date of birth, ID band Patient awake    Reviewed: Allergy & Precautions, NPO status , Patient's Chart, lab work & pertinent test results, reviewed documented beta blocker date and time   Airway Mallampati: II  TM Distance: >3 FB Neck ROM: Full    Dental no notable dental hx. (+) Teeth Intact, Dental Advisory Given   Pulmonary Current Smoker and Patient abstained from smoking.   Pulmonary exam normal breath sounds clear to auscultation       Cardiovascular hypertension, Pt. on home beta blockers and Pt. on medications Normal cardiovascular exam Rhythm:Regular Rate:Normal     Neuro/Psych  PSYCHIATRIC DISORDERS Anxiety     negative neurological ROS     GI/Hepatic negative GI ROS, Neg liver ROS,,,  Endo/Other  negative endocrine ROS    Renal/GU negative Renal ROS  negative genitourinary   Musculoskeletal negative musculoskeletal ROS (+)    Abdominal   Peds  Hematology negative hematology ROS (+)   Anesthesia Other Findings   Reproductive/Obstetrics                             Anesthesia Physical Anesthesia Plan  ASA: 2  Anesthesia Plan: General and Regional   Post-op Pain Management: Regional block* and Tylenol PO (pre-op)*   Induction: Intravenous  PONV Risk Score and Plan: 2 and Ondansetron, Dexamethasone and Midazolam  Airway Management Planned: LMA  Additional Equipment:   Intra-op Plan:   Post-operative Plan: Extubation in OR  Informed Consent: I have reviewed the patients History and Physical, chart, labs and discussed the procedure including the risks, benefits and alternatives for the proposed anesthesia with the patient or authorized representative who has indicated his/her understanding and acceptance.     Dental advisory given  Plan Discussed with: CRNA  Anesthesia Plan Comments:        Anesthesia Quick  Evaluation

## 2022-06-06 NOTE — H&P (Signed)
52 yof with no prior history, has fh in mom at age 53, mgm at 84 and first cousin ([place] at 53). She had no mass or dc. Screening mm shows b density tissue. She has a right sided mass. On [place] this is a 5x4x5 mm mass. Ax [place] negative. 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 No Ki done at this site. She is here with her husband to discuss options.  Review of Systems: A complete review of systems was obtained from the patient. I have reviewed this information and discussed as appropriate with the patient. See HPI as well for other ROS.  Review of Systems Endo/Heme/Allergies: Bruises/bleeds easily. Psychiatric/Behavioral: The patient is nervous/anxious. All other systems reviewed and are negative.  Medical History: Past Medical History: Diagnosis Date Allergic state Anxiety COVID-19 05/2019  Patient Active Problem List Diagnosis Anxiety Overweight (BMI 25.0-29.9) Allergic rhinitis  Past Surgical History: Procedure Laterality Date FUNCTIONAL ENDOSCOPIC SINUS SURGERY 2002 COLONOSCOPY 09/10/2013 normal colon COLONOSCOPY 08/07/2020 Normal colon/FHx CC & FHx CP/Repeat 5yr/JWB Wisdom Teeth Removal  No Known Allergies  Current Outpatient Medications on File Prior to Visit Medication Sig Dispense Refill cetirizine (ZYRTEC) 10 mg capsule Take 10 mg by mouth once daily. cholecalciferol (VITAMIN D3) 1000 unit tablet Take by mouth clonazePAM (KLONOPIN) 0.5 MG tablet Take 1 tablet (0.5 mg total) by mouth once daily as needed for Anxiety 30 tablet 0 escitalopram oxalate (LEXAPRO) 10 MG tablet Take 1 tablet (10 mg total) by mouth once daily 90 tablet 3 fluticasone (FLONASE) 50 mcg/actuation nasal spray Place 2 sprays into both nostrils once daily. 16 g 0 montelukast (SINGULAIR) 10 mg tablet Take 1 tablet (10 mg total) by mouth at bedtime 90 tablet 3 norgest-ethinyl estradiol triphasic (TRI-ESTARYLLA) 0.18/0.215/0.25 mg-35 mcg (28) tab Take 1  tablet by mouth once daily 95 tablet 3 vitamin B complex vit C no.3 (VITAMIN B COMP AND C NO.3 ORAL) Take 1,000 mg by mouth once daily.   Family History Problem Relation Age of Onset Colon cancer Mother 650Breast cancer Mother 53COPD Mother Coronary Artery Disease (Blocked arteries around heart) Father COPD Father Heart failure Father Alcohol abuse Father Diabetes type II Sister Other Sister TBI Dementia Sister High blood pressure (Hypertension) Brother Hyperlipidemia (Elevated cholesterol) Brother High blood pressure (Hypertension) Brother Hyperlipidemia (Elevated cholesterol) Brother Colon polyps Maternal Grandmother Breast cancer Maternal Grandmother Colon cancer Maternal Grandmother Heart failure Maternal Grandfather Kidney failure Maternal Grandfather Myocardial Infarction (Heart attack) Paternal Grandmother Parkinsonism Paternal Grandfather   Social History  Tobacco Use Smoking Status Some Days Packs/day: .25 Types: Cigarettes Smokeless Tobacco Never Marital status: Married Tobacco Use Smoking status: Some Days Packs/day: .25 Types: Cigarettes Smokeless tobacco: Never Vaping Use Vaping Use: Never used Substance and Sexual Activity Alcohol use: Yes Alcohol/week: 0.0 standard drinks of alcohol Comment: Occasionally Drug use: No Sexual activity: Yes Partners: Male Birth control/protection: OCP  Social Determinants of Health  Financial Resource Strain: Low Risk (03/05/2022) Overall Financial Resource Strain (CARDIA) Difficulty of Paying Living Expenses: Not hard at all Food Insecurity: No Food Insecurity (03/05/2022) Hunger Vital Sign Worried About Running Out of Food in the Last Year: Never true RWaltonin the Last Year: Never true Transportation Needs: No Transportation Needs (03/05/2022) PRAPARE - TControl and instrumentation engineer(Medical): No Lack of Transportation (Non-Medical): No Housing Stability: Low Risk  (03/05/2022) Housing Stability Vital Sign Unable to Pay for Housing in the Last Year: No Number of  Places Lived in the Last Year: 1 Unstable Housing in the Last Year: No  Objective:  Vitals: 05/22/22 1054 05/22/22 1055 BP: (!) 150/92 Pulse: 95 Temp: 36.9 C (98.4 F) SpO2: 98% Weight: 74.4 kg (164 lb) Height: 160 cm (5' 3"$ ) PainSc: 0-No pain  Body mass index is 29.05 kg/m.  Physical Exam Vitals reviewed. Constitutional: Appearance: Normal appearance. Chest: Breasts: Right: No inverted nipple, mass or nipple discharge. Left: No inverted nipple, mass or nipple discharge. Lymphadenopathy: Upper Body: Right upper body: No supraclavicular or axillary adenopathy. Left upper body: No supraclavicular or axillary adenopathy. Neurological: Mental Status: She is alert.  Assessment and Plan:t:  Malignant neoplasm of upper-outer quadrant of right breast in female, estrogen receptor positive  Right breast seed guided lumpectomy, right axillary sentinel node biopsy  We discussed the staging and pathophysiology of breast cancer. We discussed all of the different options for treatment for breast cancer including surgery, chemotherapy, radiation therapy, Herceptin, and antiestrogen therapy.  Genetics was negative.  We discussed a sentinel lymph node biopsy as she does not appear to having lymph node involvement right now. We discussed the performance of that with injection of tracer. Discussed small risk of discoloration at injection site. We discussed that there is a chance of having a positive node with a sentinel lymph node biopsy and we will await the permanent pathology to make any other first further decisions in terms of her treatment. We discussed up to a 5% risk lifetime of chronic shoulder pain as well as lymphedema associated with a sentinel lymph node biopsy.  We discussed the options for treatment of the breast cancer which included lumpectomy versus a mastectomy. We  discussed the performance of the lumpectomy with radioactive seed placement. We discussed a 5-10% chance of a positive margin requiring reexcision in the operating room. We also discussed that she will likely need radiation therapy if she undergoes lumpectomy. We discussed mastectomy and the postoperative care for that as well. Mastectomy can be followed by reconstruction. The decision for lumpectomy vs mastectomy has no impact on decision for chemotherapy. Most mastectomy patients will not need radiation therapy. We discussed that there is no difference in her survival whether she undergoes lumpectomy with radiation therapy or antiestrogen therapy versus a mastectomy. There is also no real difference between her recurrence in the breast.  We discussed the risks of operation including bleeding, infection, possible reoperation. She understands her further therapy will be based on what her stages at the time of her operation.

## 2022-06-07 ENCOUNTER — Ambulatory Visit (HOSPITAL_COMMUNITY): Payer: BC Managed Care – PPO | Admitting: Vascular Surgery

## 2022-06-07 ENCOUNTER — Encounter (HOSPITAL_COMMUNITY): Payer: Self-pay | Admitting: General Surgery

## 2022-06-07 ENCOUNTER — Encounter (HOSPITAL_COMMUNITY)
Admission: RE | Disposition: A | Discharge status: Home / Self Care | Payer: Self-pay | Source: Home / Self Care | Attending: General Surgery

## 2022-06-07 ENCOUNTER — Ambulatory Visit
Admission: RE | Admit: 2022-06-07 | Discharge: 2022-06-07 | Disposition: A | Discharge status: Home / Self Care | Payer: BC Managed Care – PPO | Source: Ambulatory Visit | Attending: General Surgery | Admitting: General Surgery

## 2022-06-07 ENCOUNTER — Ambulatory Visit (HOSPITAL_COMMUNITY)
Admission: RE | Admit: 2022-06-07 | Discharge: 2022-06-07 | Disposition: A | Discharge status: Home / Self Care | Payer: BC Managed Care – PPO | Attending: General Surgery | Admitting: General Surgery

## 2022-06-07 ENCOUNTER — Other Ambulatory Visit: Payer: Self-pay

## 2022-06-07 ENCOUNTER — Ambulatory Visit (HOSPITAL_COMMUNITY): Payer: BC Managed Care – PPO | Admitting: Physician Assistant

## 2022-06-07 DIAGNOSIS — Z803 Family history of malignant neoplasm of breast: Secondary | ICD-10-CM | POA: Insufficient documentation

## 2022-06-07 DIAGNOSIS — I10 Essential (primary) hypertension: Secondary | ICD-10-CM | POA: Diagnosis not present

## 2022-06-07 DIAGNOSIS — F17210 Nicotine dependence, cigarettes, uncomplicated: Secondary | ICD-10-CM | POA: Diagnosis not present

## 2022-06-07 DIAGNOSIS — F419 Anxiety disorder, unspecified: Secondary | ICD-10-CM | POA: Diagnosis not present

## 2022-06-07 DIAGNOSIS — C50911 Malignant neoplasm of unspecified site of right female breast: Secondary | ICD-10-CM

## 2022-06-07 DIAGNOSIS — C50411 Malignant neoplasm of upper-outer quadrant of right female breast: Secondary | ICD-10-CM | POA: Diagnosis present

## 2022-06-07 DIAGNOSIS — Z170 Estrogen receptor positive status [ER+]: Secondary | ICD-10-CM | POA: Diagnosis not present

## 2022-06-07 HISTORY — PX: AXILLARY SENTINEL NODE BIOPSY: SHX5738

## 2022-06-07 HISTORY — PX: BREAST LUMPECTOMY WITH RADIOACTIVE SEED AND SENTINEL LYMPH NODE BIOPSY: SHX6550

## 2022-06-07 SURGERY — BREAST LUMPECTOMY WITH RADIOACTIVE SEED AND SENTINEL LYMPH NODE BIOPSY
Anesthesia: Regional | Site: Breast | Laterality: Right

## 2022-06-07 MED ORDER — DEXAMETHASONE SODIUM PHOSPHATE 10 MG/ML IJ SOLN
INTRAMUSCULAR | Status: DC | PRN
  Administered 2022-06-07: 5 mg

## 2022-06-07 MED ORDER — FENTANYL CITRATE (PF) 250 MCG/5ML IJ SOLN
INTRAMUSCULAR | Status: AC
  Filled 2022-06-07: qty 5

## 2022-06-07 MED ORDER — FENTANYL CITRATE (PF) 250 MCG/5ML IJ SOLN
INTRAMUSCULAR | Status: DC | PRN
  Administered 2022-06-07: 50 ug via INTRAVENOUS

## 2022-06-07 MED ORDER — DEXAMETHASONE SODIUM PHOSPHATE 10 MG/ML IJ SOLN
INTRAMUSCULAR | Status: AC
  Filled 2022-06-07: qty 1

## 2022-06-07 MED ORDER — ACETAMINOPHEN 500 MG PO TABS
1000.0000 mg | ORAL_TABLET | ORAL | Status: AC
  Administered 2022-06-07: 1000 mg via ORAL
  Filled 2022-06-07: qty 2

## 2022-06-07 MED ORDER — LIDOCAINE 2% (20 MG/ML) 5 ML SYRINGE
INTRAMUSCULAR | Status: AC
  Filled 2022-06-07: qty 5

## 2022-06-07 MED ORDER — ACETAMINOPHEN 500 MG PO TABS: 1000.0000 mg | ORAL_TABLET | Freq: Once | ORAL | Status: DC

## 2022-06-07 MED ORDER — ROPIVACAINE HCL 5 MG/ML IJ SOLN
INTRAMUSCULAR | Status: DC | PRN
  Administered 2022-06-07: 30 mL via PERINEURAL

## 2022-06-07 MED ORDER — CHLORHEXIDINE GLUCONATE CLOTH 2 % EX PADS: 6.0000 | MEDICATED_PAD | Freq: Once | CUTANEOUS | Status: DC

## 2022-06-07 MED ORDER — BUPIVACAINE HCL (PF) 0.25 % IJ SOLN
INTRAMUSCULAR | Status: AC
  Filled 2022-06-07: qty 30

## 2022-06-07 MED ORDER — BUPIVACAINE HCL (PF) 0.25 % IJ SOLN
INTRAMUSCULAR | Status: DC | PRN
  Administered 2022-06-07: 6 mL

## 2022-06-07 MED ORDER — TRAMADOL HCL 50 MG PO TABS: 50.0000 mg | ORAL_TABLET | Freq: Four times a day (QID) | ORAL | 0 refills | Status: DC | PRN

## 2022-06-07 MED ORDER — KETOROLAC TROMETHAMINE 15 MG/ML IJ SOLN
15.0000 mg | INTRAMUSCULAR | Status: AC
  Administered 2022-06-07: 15 mg via INTRAVENOUS
  Filled 2022-06-07: qty 1

## 2022-06-07 MED ORDER — PROPOFOL 10 MG/ML IV BOLUS
INTRAVENOUS | Status: AC
  Filled 2022-06-07: qty 20

## 2022-06-07 MED ORDER — HEMOSTATIC AGENTS (NO CHARGE) OPTIME
TOPICAL | Status: DC | PRN
  Administered 2022-06-07: 1

## 2022-06-07 MED ORDER — PHENYLEPHRINE 80 MCG/ML (10ML) SYRINGE FOR IV PUSH (FOR BLOOD PRESSURE SUPPORT)
PREFILLED_SYRINGE | INTRAVENOUS | Status: DC | PRN
  Administered 2022-06-07: 80 ug via INTRAVENOUS
  Administered 2022-06-07: 160 ug via INTRAVENOUS
  Administered 2022-06-07: 80 ug via INTRAVENOUS

## 2022-06-07 MED ORDER — LIDOCAINE 2% (20 MG/ML) 5 ML SYRINGE
INTRAMUSCULAR | Status: DC | PRN
  Administered 2022-06-07: 40 mg via INTRAVENOUS

## 2022-06-07 MED ORDER — ARTIFICIAL TEARS OPHTHALMIC OINT
TOPICAL_OINTMENT | OPHTHALMIC | Status: AC
  Filled 2022-06-07: qty 3.5

## 2022-06-07 MED ORDER — CHLORHEXIDINE GLUCONATE 0.12 % MT SOLN
15.0000 mL | Freq: Once | OROMUCOSAL | Status: AC
  Administered 2022-06-07: 15 mL via OROMUCOSAL
  Filled 2022-06-07: qty 15

## 2022-06-07 MED ORDER — CHLORHEXIDINE GLUCONATE 0.12 % MT SOLN: 15.0000 mL | Freq: Once | OROMUCOSAL | Status: DC

## 2022-06-07 MED ORDER — ONDANSETRON HCL 4 MG/2ML IJ SOLN
INTRAMUSCULAR | Status: AC
  Filled 2022-06-07: qty 2

## 2022-06-07 MED ORDER — DEXAMETHASONE SODIUM PHOSPHATE 10 MG/ML IJ SOLN
INTRAMUSCULAR | Status: DC | PRN
  Administered 2022-06-07: 10 mg via INTRAVENOUS

## 2022-06-07 MED ORDER — MAGTRACE LYMPHATIC TRACER
INTRAMUSCULAR | Status: DC | PRN
  Administered 2022-06-07: 2 mL via INTRAMUSCULAR

## 2022-06-07 MED ORDER — ONDANSETRON HCL 4 MG/2ML IJ SOLN
INTRAMUSCULAR | Status: DC | PRN
  Administered 2022-06-07: 4 mg via INTRAVENOUS

## 2022-06-07 MED ORDER — 0.9 % SODIUM CHLORIDE (POUR BTL) OPTIME
TOPICAL | Status: DC | PRN
  Administered 2022-06-07: 1000 mL

## 2022-06-07 MED ORDER — PHENYLEPHRINE 80 MCG/ML (10ML) SYRINGE FOR IV PUSH (FOR BLOOD PRESSURE SUPPORT)
PREFILLED_SYRINGE | INTRAVENOUS | Status: AC
  Filled 2022-06-07: qty 10

## 2022-06-07 MED ORDER — MIDAZOLAM HCL 2 MG/2ML IJ SOLN
INTRAMUSCULAR | Status: AC
  Filled 2022-06-07: qty 2

## 2022-06-07 MED ORDER — MIDAZOLAM HCL 2 MG/2ML IJ SOLN
INTRAMUSCULAR | Status: DC | PRN
  Administered 2022-06-07: 2 mg via INTRAVENOUS

## 2022-06-07 MED ORDER — EPHEDRINE SULFATE-NACL 50-0.9 MG/10ML-% IV SOSY
PREFILLED_SYRINGE | INTRAVENOUS | Status: DC | PRN
  Administered 2022-06-07: 5 mg via INTRAVENOUS
  Administered 2022-06-07: 10 mg via INTRAVENOUS
  Administered 2022-06-07 (×2): 5 mg via INTRAVENOUS

## 2022-06-07 MED ORDER — ENSURE PRE-SURGERY PO LIQD: 296.0000 mL | Freq: Once | ORAL | Status: DC

## 2022-06-07 MED ORDER — PROPOFOL 10 MG/ML IV BOLUS
INTRAVENOUS | Status: DC | PRN
  Administered 2022-06-07: 180 mg via INTRAVENOUS

## 2022-06-07 MED ORDER — CEFAZOLIN SODIUM-DEXTROSE 2-4 GM/100ML-% IV SOLN
2.0000 g | INTRAVENOUS | Status: AC
  Administered 2022-06-07: 2 g via INTRAVENOUS
  Filled 2022-06-07: qty 100

## 2022-06-07 MED ORDER — ORAL CARE MOUTH RINSE: 15.0000 mL | Freq: Once | OROMUCOSAL | Status: AC

## 2022-06-07 MED ORDER — CEFAZOLIN SODIUM 1 G IJ SOLR
INTRAMUSCULAR | Status: AC
  Filled 2022-06-07: qty 20

## 2022-06-07 MED ORDER — ORAL CARE MOUTH RINSE: 15.0000 mL | Freq: Once | OROMUCOSAL | Status: DC

## 2022-06-07 MED ORDER — LACTATED RINGERS IV SOLN: INTRAVENOUS | Status: DC

## 2022-06-07 SURGICAL SUPPLY — 49 items
APPLIER CLIP 9.375 MED OPEN (MISCELLANEOUS)
BAG COUNTER SPONGE SURGICOUNT (BAG) ×1 IMPLANT
BINDER BREAST LRG (GAUZE/BANDAGES/DRESSINGS) IMPLANT
BINDER BREAST XLRG (GAUZE/BANDAGES/DRESSINGS) IMPLANT
CANISTER SUCT 3000ML PPV (MISCELLANEOUS) ×1 IMPLANT
CHLORAPREP W/TINT 26 (MISCELLANEOUS) ×1 IMPLANT
CLIP APPLIE 9.375 MED OPEN (MISCELLANEOUS) IMPLANT
CLIP VESOCCLUDE MED 6/CT (CLIP) ×1 IMPLANT
COVER PROBE W GEL 5X96 (DRAPES) ×2 IMPLANT
COVER SURGICAL LIGHT HANDLE (MISCELLANEOUS) ×1 IMPLANT
DERMABOND ADVANCED .7 DNX12 (GAUZE/BANDAGES/DRESSINGS) ×1 IMPLANT
DEVICE DUBIN SPECIMEN MAMMOGRA (MISCELLANEOUS) ×1 IMPLANT
DRAPE CHEST BREAST 15X10 FENES (DRAPES) ×1 IMPLANT
ELECT BLADE 4.0 EZ CLEAN MEGAD (MISCELLANEOUS) ×1
ELECT COATED BLADE 2.86 ST (ELECTRODE) ×1 IMPLANT
ELECT REM PT RETURN 9FT ADLT (ELECTROSURGICAL) ×1
ELECTRODE BLDE 4.0 EZ CLN MEGD (MISCELLANEOUS) IMPLANT
ELECTRODE REM PT RTRN 9FT ADLT (ELECTROSURGICAL) ×1 IMPLANT
GLOVE BIO SURGEON STRL SZ7 (GLOVE) ×1 IMPLANT
GLOVE BIOGEL PI IND STRL 7.5 (GLOVE) ×1 IMPLANT
GOWN STRL REUS W/ TWL LRG LVL3 (GOWN DISPOSABLE) ×2 IMPLANT
GOWN STRL REUS W/TWL LRG LVL3 (GOWN DISPOSABLE) ×2
HEMOSTAT ARISTA ABSORB 3G PWDR (HEMOSTASIS) IMPLANT
ILLUMINATOR WAVEGUIDE N/F (MISCELLANEOUS) IMPLANT
KIT BASIN OR (CUSTOM PROCEDURE TRAY) ×1 IMPLANT
KIT MARKER MARGIN INK (KITS) ×1 IMPLANT
NDL 18GX1X1/2 (RX/OR ONLY) (NEEDLE) IMPLANT
NDL FILTER BLUNT 18X1 1/2 (NEEDLE) IMPLANT
NDL HYPO 25GX1X1/2 BEV (NEEDLE) ×1 IMPLANT
NEEDLE 18GX1X1/2 (RX/OR ONLY) (NEEDLE) IMPLANT
NEEDLE FILTER BLUNT 18X1 1/2 (NEEDLE) ×1 IMPLANT
NEEDLE HYPO 25GX1X1/2 BEV (NEEDLE) ×1 IMPLANT
NS IRRIG 1000ML POUR BTL (IV SOLUTION) ×1 IMPLANT
PACK GENERAL/GYN (CUSTOM PROCEDURE TRAY) ×1 IMPLANT
RETRACTOR ONETRAX LX 90X20 (MISCELLANEOUS) IMPLANT
STRIP CLOSURE SKIN 1/2X4 (GAUZE/BANDAGES/DRESSINGS) ×1 IMPLANT
STRIP SURGICAL 1/2 X 6 IN (GAUZE/BANDAGES/DRESSINGS) IMPLANT
SUT MNCRL AB 4-0 PS2 18 (SUTURE) ×2 IMPLANT
SUT MON AB 5-0 PS2 18 (SUTURE) IMPLANT
SUT SILK 2 0 SH (SUTURE) IMPLANT
SUT VIC AB 2-0 SH 27 (SUTURE) ×3
SUT VIC AB 2-0 SH 27X BRD (SUTURE) IMPLANT
SUT VIC AB 2-0 SH 27XBRD (SUTURE) ×2 IMPLANT
SUT VIC AB 3-0 SH 27 (SUTURE) ×2
SUT VIC AB 3-0 SH 27X BRD (SUTURE) ×2 IMPLANT
SYR CONTROL 10ML LL (SYRINGE) ×1 IMPLANT
TOWEL GREEN STERILE (TOWEL DISPOSABLE) ×1 IMPLANT
TOWEL GREEN STERILE FF (TOWEL DISPOSABLE) ×1 IMPLANT
TRACER MAGTRACE VIAL (MISCELLANEOUS) IMPLANT

## 2022-06-07 NOTE — Anesthesia Procedure Notes (Signed)
Anesthesia Regional Block: Pectoralis block   Pre-Anesthetic Checklist: , timeout performed,  Correct Patient, Correct Site, Correct Laterality,  Correct Procedure, Correct Position, site marked,  Risks and benefits discussed,  Pre-op evaluation,  At surgeon's request and post-op pain management  Laterality: Right  Prep: Maximum Sterile Barrier Precautions used, chloraprep       Needles:  Injection technique: Single-shot  Needle Type: Echogenic Stimulator Needle     Needle Length: 9cm  Needle Gauge: 21     Additional Needles:   Procedures:,,,, ultrasound used (permanent image in chart),,    Narrative:  Start time: 06/07/2022 7:05 AM End time: 06/07/2022 7:09 AM Injection made incrementally with aspirations every 5 mL. Anesthesiologist: Freddrick March, MD

## 2022-06-07 NOTE — Transfer of Care (Signed)
Immediate Anesthesia Transfer of Care Note  Patient: Rhonda Charles  Procedure(s) Performed: RIGHT BREAST LUMPECTOMY WITH RADIOACTIVE SEED (Right: Breast) AXILLARY SENTINEL LYMPH NODE BIOPSY (Right: Breast)  Patient Location: PACU  Anesthesia Type:GA combined with regional for post-op pain  Level of Consciousness: awake, alert , and oriented  Airway & Oxygen Therapy: Patient Spontanous Breathing  Post-op Assessment: Report given to RN, Post -op Vital signs reviewed and stable, and Patient moving all extremities X 4  Post vital signs: Reviewed and stable  Last Vitals:  Vitals Value Taken Time  BP 127/64 06/07/22 0909  Temp    Pulse 83 06/07/22 0913  Resp 21 06/07/22 0913  SpO2 95 % 06/07/22 0913  Vitals shown include unvalidated device data.  Last Pain:  Vitals:   06/07/22 0609  TempSrc:   PainSc: 0-No pain         Complications: No notable events documented.

## 2022-06-07 NOTE — Anesthesia Procedure Notes (Addendum)
Procedure Name: LMA Insertion Date/Time: 06/07/2022 7:36 AM  Performed by: Kyung Rudd, CRNAPre-anesthesia Checklist: Patient identified, Emergency Drugs available, Suction available and Patient being monitored Patient Re-evaluated:Patient Re-evaluated prior to induction Oxygen Delivery Method: Circle System Utilized Preoxygenation: Pre-oxygenation with 100% oxygen Induction Type: IV induction Ventilation: Mask ventilation without difficulty LMA: LMA inserted LMA Size: 4.0 Number of attempts: 1 Placement Confirmation: positive ETCO2 Tube secured with: Tape Dental Injury: Teeth and Oropharynx as per pre-operative assessment  Comments: LMA placed by Taelor Chrisco, paramedic student, with Dr. Marcie Bal supervising.

## 2022-06-07 NOTE — Discharge Instructions (Signed)
[facility] Office Phone Number [phone]  POST OP INSTRUCTIONS Take 400 mg of ibuprofen every 8 hours or 650 mg tylenol every 6 hours for next 72 hours then as needed. Use ice several times daily also.  A prescription for pain medication may be given to you upon discharge.  Take your pain medication as prescribed, if needed.  If narcotic pain medicine is not needed, then you may take acetaminophen (Tylenol), naprosyn (Alleve) or ibuprofen (Advil) as needed. Take your usually prescribed medications unless otherwise directed If you need a refill on your pain medication, please contact your pharmacy.  They will contact our office to request authorization.  Prescriptions will not be filled after 5pm or on week-ends. You should eat very light the first 24 hours after surgery, such as soup, crackers, pudding, etc.  Resume your normal diet the day after surgery. Most patients will experience some swelling and bruising in the breast.  Ice packs and a good support bra will help.  Wear the breast binder provided or a sports bra for 72 hours day and night.  After that wear a sports bra during the day until you return to the office. Swelling and bruising can take several days to resolve.  It is common to experience some constipation if taking pain medication after surgery.  Increasing fluid intake and taking a stool softener will usually help or prevent this problem from occurring.  A mild laxative (Milk of Magnesia or Miralax) should be taken according to package directions if there are no bowel movements after 48 hours. I used skin glue on the incision, you may shower in 24 hours.  The glue will flake off over the next 2-3 weeks.  Any sutures or staples will be removed at the office during your follow-up visit. ACTIVITIES:  You may resume regular daily activities (gradually increasing) beginning the next day.  Wearing a good support bra or sports bra minimizes pain and swelling.  You may have  sexual intercourse when it is comfortable. You may drive when you no longer are taking prescription pain medication, you can comfortably wear a seatbelt, and you can safely maneuver your car and apply brakes. RETURN TO WORK:  ______________________________________________________________________________________ You should see your doctor in the office for a follow-up appointment approximately two weeks after your surgery.  Your doctor's nurse will typically make your follow-up appointment when she calls you with your pathology report.  Expect your pathology report 3-4 business days after your surgery.  You may call to check if you do not hear from us after three days. OTHER INSTRUCTIONS: _______________________________________________________________________________________________ _____________________________________________________________________________________________________________________________________ _____________________________________________________________________________________________________________________________________ _____________________________________________________________________________________________________________________________________  WHEN TO CALL DR Jcion Buddenhagen: Fever over 101.0 Nausea and/or vomiting. Extreme swelling or bruising. Continued bleeding from incision. Increased pain, redness, or drainage from the incision.  The clinic staff is available to answer your questions during regular business hours.  Please don't hesitate to call and ask to speak to one of the nurses for clinical concerns.  If you have a medical emergency, go to the nearest emergency room or call 911.  A surgeon from [facility] is always on call at the hospital.  For further questions, please visit [email or web address]  

## 2022-06-07 NOTE — Interval H&P Note (Signed)
History and Physical Interval Note:  06/07/2022 6:57 AM  Rhonda Charles  has presented today for surgery, with the diagnosis of RIGHT BREAST CANCER.  The various methods of treatment have been discussed with the patient and family. After consideration of risks, benefits and other options for treatment, the patient has consented to  Procedure(s): RIGHT BREAST LUMPECTOMY WITH RADIOACTIVE SEED AND AXILLARY SENTINEL LYMPH NODE BIOPSY (Right) as a surgical intervention.  The patient's history has been reviewed, patient examined, no change in status, stable for surgery.  I have reviewed the patient's chart and labs.  Questions were answered to the patient's satisfaction.     Rolm Bookbinder

## 2022-06-07 NOTE — Op Note (Signed)
  Preoperative diagnosis: Clinical stage I right breast cancer Postoperative diagnosis: Same as above Procedure 1.  Right breast radioactive seed guided lumpectomy 2.  Injection of mag trace for sentinel lymph node identification 3.  Right deep axillary sentinel lymph node biopsy Surgeon: Dr. Serita Grammes Estimated blood loss: Minimal Anesthesia: General with a pectoral block Drains: None Specimens: 1.  Right breast tissue marked with paint containing clip and seed 2. Additional lateral, inferior and posterior margins marked short stitch superior, long stitch lateral, double stitch deep, additional anterior margin 3. Right deep axillary sentinel nodes Complications: None Special counts correct completion Disposition recovery stable condition   Indications: 17 yof with no prior history, has fh in mom at age 9, mgm at 51 and first cousin ([place] at 47). She had no mass or dc. Screening mm shows b density tissue. She has a right sided mass. On [place] this is a 5x4x5 mm mass. Ax [place] negative. 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 No Ki done at this site. We discussed lumpectomy and sn biopsy.   Procedure: After informed consent obtained she first underwent a pectoral block.  Antibiotics were given.  SCDs were placed.  She was placed under general anesthesia without complication.  She was prepped and draped in standard sterile surgical fashion.  Surgical timeout was then performed.   I then injected 2 cc of mag trace in the subareolar position and massages for 5 minutes.   I first did the lumpectomy. I made a periareolar incision in order to hide the scar.  I dissected to the seed.It was very deep in the breast.  I then using the neoprobe I removed the mass and surrounding tissue to get a clear margin. MM confirmed removal of the seed and the clip.   I did a 3D image and I thought I was close to several margins so I removed these.  I then obtained  hemostasis.  I placed clips in the cavity.  I closed the breast tissue down with 2-0 Vicryl. I closed the skin with 3-0 vicryl and 5-0 monocryl. Glue and steristrips were placed.    I made an axillary incision and carried this through the axillary fascia.  I dissected through the axillary fascia into the axilla proper.  Using the magtrace was able to identify a couple of lymph nodes with highest count of [id number].  These were removed and passed off the table as sentinel nodes.  I then obtained hemostasis. There were no other activity and there were no palpable nodes present. The skin was closed with 3-0 Vicryl as well as 4-0 Monocryl.  Glue and Steri-Strips were eventually placed.    She tolerated this well was extubated transferred recovery stable.

## 2022-06-07 NOTE — Anesthesia Postprocedure Evaluation (Signed)
Anesthesia Post Note  Patient: Rhonda Charles  Procedure(s) Performed: RIGHT BREAST LUMPECTOMY WITH RADIOACTIVE SEED (Right: Breast) AXILLARY SENTINEL LYMPH NODE BIOPSY (Right: Breast)     Patient location during evaluation: PACU Anesthesia Type: Regional and General Level of consciousness: awake and alert Pain management: pain level controlled Vital Signs Assessment: post-procedure vital signs reviewed and stable Respiratory status: spontaneous breathing, nonlabored ventilation, respiratory function stable and patient connected to nasal cannula oxygen Cardiovascular status: blood pressure returned to baseline and stable Postop Assessment: no apparent nausea or vomiting Anesthetic complications: no  No notable events documented.  Last Vitals:  Vitals:   06/07/22 0915 06/07/22 1000  BP: 131/70 (!) 140/84  Pulse: 75 82  Resp: 17 16  Temp: 36.7 C 36.5 C  SpO2: 96% 96%    Last Pain:  Vitals:   06/07/22 1000  TempSrc:   PainSc: 0-No pain                 Sallie Staron L Webber Michiels

## 2022-06-08 ENCOUNTER — Encounter (HOSPITAL_COMMUNITY): Payer: Self-pay | Admitting: General Surgery

## 2022-06-11 ENCOUNTER — Other Ambulatory Visit: Payer: BC Managed Care – PPO

## 2022-06-11 ENCOUNTER — Encounter: Payer: BC Managed Care – PPO | Admitting: Licensed Clinical Social Worker

## 2022-06-13 ENCOUNTER — Encounter: Payer: Self-pay | Admitting: *Deleted

## 2022-06-13 ENCOUNTER — Encounter (HOSPITAL_COMMUNITY): Payer: Self-pay

## 2022-06-13 NOTE — Progress Notes (Signed)
Oncotype Dx order [id number] submitted online on specimen [id number]

## 2022-06-14 ENCOUNTER — Encounter: Payer: Self-pay | Admitting: *Deleted

## 2022-06-14 LAB — SURGICAL PATHOLOGY

## 2022-06-14 NOTE — Progress Notes (Signed)
Ms. Light called to ask some questions about her pathology and oncotype dx testing.

## 2022-06-21 ENCOUNTER — Encounter: Payer: Self-pay | Admitting: *Deleted

## 2022-06-21 NOTE — Progress Notes (Deleted)
Accompanied patient and family to initial medical oncology appointment.   Reviewed Breast Cancer treatment handbook.   Care plan summary given to patient.   Reviewed outreach programs and cancer center services.   

## 2022-06-21 NOTE — Progress Notes (Signed)
Oncotype results still pending, patient is scheduled to see Dr. Grayland Ormond and Dr. Baruch Gouty on Tuesday.  Discussed with Dr. Grayland Ormond and appointments have been moved out a week.   Left VM with new appt. Details, asking for return call if new appt's don't work.

## 2022-06-24 NOTE — Therapy (Signed)
OUTPATIENT PHYSICAL THERAPY BREAST CANCER POST OP FOLLOW UP   Patient Name: Rhonda Charles MRN: [id number] DOB:08-Feb-1971, 52 y.o., female Today's Date: 06/25/2022  END OF SESSION:    PT End of Session - 06/25/22 1403     Visit Number 2    Number of Visits 10   Date for PT Re-Evaluation 07/23/22    PT Start Time [id number]    PT Stop Time [id number]    PT Time Calculation (min) 46 min    Activity Tolerance Patient tolerated treatment well    Behavior During Therapy [facility] for tasks assessed/performed             Past Medical History:  Diagnosis Date   Allergy    Seasonal   Anxiety    Hypertension    Past Surgical History:  Procedure Laterality Date   AXILLARY SENTINEL NODE BIOPSY Right 06/07/2022   Procedure: AXILLARY SENTINEL LYMPH NODE BIOPSY;  Surgeon: Rolm Bookbinder, MD;  Location: [place];  Service: General;  Laterality: Right;   BREAST BIOPSY Right 05/02/2022   US biopsy/ ribbon clip/ path pending   BREAST BIOPSY Right 05/02/2022   [place] RT BREAST BX W LOC DEV 1ST LESION IMG BX [place] US GUIDE 05/02/2022 [facility]   BREAST BIOPSY  06/04/2022   MM RT RADIOACTIVE SEED LOC MAMMO GUIDE 06/04/2022 [facility]   BREAST LUMPECTOMY WITH RADIOACTIVE SEED AND SENTINEL LYMPH NODE BIOPSY Right 06/07/2022   Procedure: RIGHT BREAST LUMPECTOMY WITH RADIOACTIVE SEED;  Surgeon: Rolm Bookbinder, MD;  Location: [place];  Service: General;  Laterality: Right;   COLONOSCOPY     WISDOM TOOTH EXTRACTION Bilateral    Patient Active Problem List   Diagnosis Date Noted   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[place]) 05/07/2022   Allergic rhinitis 01/21/2018   Anxiety 08/09/2014   Overweight (BMI 25.0-29.9) 08/09/2014    PCP: Wayland Denis PA-C  REFERRING PROVIDER: Rolm Bookbinder, MD  REFERRING DIAG: [phone] (ICD-10-CM) - Malignant neoplasm of upper-outer quadrant of right female breast ([place])   THERAPY DIAG:  Stiffness of right shoulder, not elsewhere classified  Abnormal  posture  Aftercare following surgery for neoplasm  Malignant neoplasm of upper-outer quadrant of right breast in female, estrogen receptor positive ([place])  Rationale for Evaluation and Treatment: Rehabilitation  ONSET DATE: 05/02/22  SUBJECTIVE:                                                                                                                                                                                           SUBJECTIVE STATEMENT: I feel the pull sometimes when I reach up. The lymph node scar is still healing.  PERTINENT HISTORY:  Patient was diagnosed on 05/02/22 with right grade 1. It measures 5 x 4 x 5 mm and is located in the upper-outer quadrant. It is ER/PR+, Her2-. Ki67 not completed. 06/07/22: R breast lumpectomy SLNB 0/3    PATIENT GOALS:  Reassess how my recovery is going related to arm function, pain, and swelling.  PAIN:  Are you having pain? Yes: NPRS scale: 2/10 Pain location: R axilla Pain description: dull ache Aggravating factors: touching it  Relieving factors: ibuprofen  PRECAUTIONS: Recent Surgery, right UE Lymphedema risk,   ACTIVITY LEVEL / LEISURE: doing post op stretches, squeezing hand strengthening ball   OBJECTIVE:   PATIENT SURVEYS:  QUICK DASH:  Quick Dash - 06/25/22 0001     Open a tight or new jar Mild difficulty    Do heavy household chores (wash walls, wash floors) No difficulty    Carry a shopping bag or briefcase No difficulty    Wash your back Mild difficulty    Use a knife to cut food No difficulty    Recreational activities in which you take some force or impact through your arm, shoulder, or hand (golf, hammering, tennis) No difficulty    During the past week, to what extent has your arm, shoulder or hand problem interfered with your normal social activities with family, friends, neighbors, or groups? Not at all    During the past week, to what extent has your arm, shoulder or hand problem limited your work or other  regular daily activities Slightly    Arm, shoulder, or hand pain. Mild    Tingling (pins and needles) in your arm, shoulder, or hand Mild    Difficulty Sleeping No difficulty    DASH Score 11.36 %              OBSERVATIONS: R breast still with some post op edema, encouraged pt to wear her compression bra, cording visible in R axilla  POSTURE:  Forward head and rounded shoulders posture   UPPER EXTREMITY AROM/PROM:   A/PROM RIGHT   eval   RIGHT 06/25/22  Shoulder extension 63 75  Shoulder flexion 168 140  Shoulder abduction 173 147  Shoulder internal rotation 38 60  Shoulder external rotation 83 82                          (Blank rows = not tested)   A/PROM LEFT   eval  Shoulder extension 54  Shoulder flexion 166  Shoulder abduction 175  Shoulder internal rotation 38  Shoulder external rotation 82                          (Blank rows = not tested)   CERVICAL AROM: All within normal limits:      Percent limited  Flexion WFL  Extension WFL  Right lateral flexion WFL  Left lateral flexion WFL  Right rotation WFL  Left rotation WFL      UPPER EXTREMITY STRENGTH: 5/5   LYMPHEDEMA ASSESSMENTS:    LANDMARK RIGHT   eval RIGHT 06/25/22  10 cm proximal to olecranon process 31 31  Olecranon process 24.5 24.5  10 cm proximal to ulnar styloid process 21 20.5  Just proximal to ulnar styloid process 14.5 14.8  Across hand at thumb web space 19 19  At base of 2nd digit 5.6 5.8  (Blank rows = not tested)   LANDMARK LEFT   eval  10 cm proximal to olecranon process 31.1  Olecranon process 24.4  10 cm proximal to ulnar styloid process 21.1  Just proximal to ulnar styloid process 14  Across hand at thumb web space 19.3  At base of 2nd digit 5.5  (Blank rows = not tested)  Surgery type/Date: 06/07/22- R lumpectomy and SLNB Number of lymph nodes removed: 0/3 Current/past treatment (chemo, radiation, hormone therapy): waiting on oncotype test to determine chemo, will  need 6 weeks of radiation, will need hormone therapy Other symptoms:  Heaviness/tightness Yes Pain Yes Pitting edema No Infections No Decreased scar mobility Yes Stemmer sign No  PATIENT EDUCATION:  Education details: ABC class, axillary cording, scar mobilization, importance of stretching  Person educated: Patient Education method: Explanation and Handouts Education comprehension: verbalized understanding  HOME EXERCISE PROGRAM: Reviewed previously given post op HEP.  TREATMENT PERFORMED TODAY: 06/25/22: MFR to cording in R axilla with gently ROM  ASSESSMENT:  CLINICAL IMPRESSION: Pt returns to PT after undergoing a R lumpectomy and SLNB (0/3). She presents with decreased R shoulder ROM following surgery and palpation revealed numerous cords in R axilla. Pt has discomfort in R axilla as a result of the cording. She would benefit from skilled PT services to improve R shoulder ROM, decrease cording and progress pt towards independence with a home exercise program.   Pt will benefit from skilled therapeutic intervention to improve on the following deficits: Decreased knowledge of precautions, impaired UE functional use, pain, decreased ROM, postural dysfunction.   PT treatment/interventions: ADL/Self care home management, Therapeutic exercises, Therapeutic activity, Patient/Family education, Self Care, Joint mobilization, Orthotic/Fit training, Manual lymph drainage, Compression bandaging, Vasopneumatic device, Manual therapy, and Re-evaluation   GOALS: Goals reviewed with patient? Yes  LONG TERM GOALS:  (STG=LTG)  GOALS Name Target Date  Goal status  1 Pt will demonstrate she has regained full shoulder ROM and function post operatively compared to baselines.  Baseline: 07/23/22 NEW  2 Pt will demonstrate 165 degrees of R shoulder flexion to allow her to reach overhead. 07/23/22 NEW  3 Pt will demonstrate 170 degrees of R shoulder abduction to allow her to reach out to the side.  07/23/22 NEW  4 Pt will report she is no longer having pain at end range from cording to allow improved comfort. 07/23/22 NEW  5 Pt will be independent in a home exercise program for continued strengthening and stretching.  07/23/22 NEW     PLAN:  PT FREQUENCY/DURATION: 2x/wk for 4 wks  PLAN FOR NEXT SESSION: pulleys, ball, MFR to cording R axilla, PROM R axilla   [facility]  [address], [address]  [place] [address]  [phone]  After Breast Cancer Class It is recommended you attend the ABC class to be educated on lymphedema risk reduction. This class is free of charge and lasts for 1 hour. It is a 1-time class. You will need to download the Webex app either on your phone or computer. We will send you a link the night before or the morning of the class. You should be able to click on that link to join the class. This is not a confidential class. You don't have to turn your camera on, but other participants may be able to see your email address.  Scar massage You can begin gentle scar massage to you incision sites. Gently place one hand on the incision and move the skin (without sliding on the skin) in various directions. Do this for a few minutes  and then you can gently massage either coconut oil or vitamin E cream into the scars.  Compression garment You should continue wearing your compression bra until you feel like you no longer have swelling.  Home exercise Program Continue doing the exercises you were given until you feel like you can do them without feeling any tightness at the end.   Walking Program Studies show that 30 minutes of walking per day (fast enough to elevate your heart rate) can significantly reduce the risk of a cancer recurrence. If you can't walk due to other medical reasons, we encourage you to find another activity you could do (like a stationary bike or water exercise).  Posture After breast cancer surgery, people frequently sit with  rounded shoulders posture because it puts their incisions on slack and feels better. If you sit like this and scar tissue forms in that position, you can become very tight and have pain sitting or standing with good posture. Try to be aware of your posture and sit and stand up tall to heal properly.  Follow up PT: It is recommended you return every 3 months for the first 3 years following surgery to be assessed on the SOZO machine for an L-Dex score. This helps prevent clinically significant lymphedema in 95% of patients. These follow up screens are 10 minute appointments that you are not billed for.  [facility], PT 06/25/2022, 3:04 PM

## 2022-06-25 ENCOUNTER — Encounter: Payer: Self-pay | Admitting: Physical Therapy

## 2022-06-25 ENCOUNTER — Ambulatory Visit: Payer: BC Managed Care – PPO | Attending: General Surgery | Admitting: Physical Therapy

## 2022-06-25 ENCOUNTER — Inpatient Hospital Stay: Payer: BC Managed Care – PPO | Admitting: Oncology

## 2022-06-25 ENCOUNTER — Ambulatory Visit: Payer: BC Managed Care – PPO | Admitting: Radiation Oncology

## 2022-06-25 DIAGNOSIS — C50411 Malignant neoplasm of upper-outer quadrant of right female breast: Secondary | ICD-10-CM | POA: Diagnosis present

## 2022-06-25 DIAGNOSIS — M25611 Stiffness of right shoulder, not elsewhere classified: Secondary | ICD-10-CM

## 2022-06-25 DIAGNOSIS — R293 Abnormal posture: Secondary | ICD-10-CM

## 2022-06-25 DIAGNOSIS — Z483 Aftercare following surgery for neoplasm: Secondary | ICD-10-CM

## 2022-06-25 DIAGNOSIS — Z170 Estrogen receptor positive status [ER+]: Secondary | ICD-10-CM | POA: Insufficient documentation

## 2022-06-27 ENCOUNTER — Encounter: Payer: Self-pay | Admitting: *Deleted

## 2022-06-27 ENCOUNTER — Ambulatory Visit: Payer: BC Managed Care – PPO | Admitting: Physical Therapy

## 2022-06-27 ENCOUNTER — Encounter: Payer: Self-pay | Admitting: Physical Therapy

## 2022-06-27 DIAGNOSIS — Z483 Aftercare following surgery for neoplasm: Secondary | ICD-10-CM

## 2022-06-27 DIAGNOSIS — Z170 Estrogen receptor positive status [ER+]: Secondary | ICD-10-CM

## 2022-06-27 DIAGNOSIS — M25611 Stiffness of right shoulder, not elsewhere classified: Secondary | ICD-10-CM | POA: Diagnosis not present

## 2022-06-27 DIAGNOSIS — R293 Abnormal posture: Secondary | ICD-10-CM

## 2022-06-27 NOTE — Therapy (Signed)
OUTPATIENT PHYSICAL THERAPY BREAST CANCER POST OP FOLLOW UP   Patient Name: Rhonda Charles MRN: [id number] DOB:1970-05-28, 52 y.o., female Today's Date: 06/27/2022  END OF SESSION:  PT End of Session - 06/27/22 1430     Visit Number 3    Number of Visits 10    Date for PT Re-Evaluation 07/23/22    PT Start Time [id number]    PT Stop Time 1430    PT Time Calculation (min) 25 min    Activity Tolerance Patient tolerated treatment well    Behavior During Therapy [facility] for tasks assessed/performed              PT End of Session - 06/25/22 1403     Visit Number 2    Number of Visits 10   Date for PT Re-Evaluation 07/23/22    PT Start Time [id number]    PT Stop Time 1448    PT Time Calculation (min) 46 min    Activity Tolerance Patient tolerated treatment well    Behavior During Therapy WFL for tasks assessed/performed             Past Medical History:  Diagnosis Date   Allergy    Seasonal   Anxiety    Hypertension    Past Surgical History:  Procedure Laterality Date   AXILLARY SENTINEL NODE BIOPSY Right 06/07/2022   Procedure: AXILLARY SENTINEL LYMPH NODE BIOPSY;  Surgeon: Rolm Bookbinder, MD;  Location: [place];  Service: General;  Laterality: Right;   BREAST BIOPSY Right 05/02/2022   US biopsy/ ribbon clip/ path pending   BREAST BIOPSY Right 05/02/2022   [place] RT BREAST BX W LOC DEV 1ST LESION IMG BX [place] US GUIDE 05/02/2022 [facility]   BREAST BIOPSY  06/04/2022   MM RT RADIOACTIVE SEED LOC MAMMO GUIDE 06/04/2022 [facility]   BREAST LUMPECTOMY WITH RADIOACTIVE SEED AND SENTINEL LYMPH NODE BIOPSY Right 06/07/2022   Procedure: RIGHT BREAST LUMPECTOMY WITH RADIOACTIVE SEED;  Surgeon: Rolm Bookbinder, MD;  Location: [place];  Service: General;  Laterality: Right;   COLONOSCOPY     WISDOM TOOTH EXTRACTION Bilateral    Patient Active Problem List   Diagnosis Date Noted   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[place]) 05/07/2022   Allergic rhinitis 01/21/2018    Anxiety 08/09/2014   Overweight (BMI 25.0-29.9) 08/09/2014    PCP: Wayland Denis PA-C  REFERRING PROVIDER: Rolm Bookbinder, MD  REFERRING DIAG: [phone] (ICD-10-CM) - Malignant neoplasm of upper-outer quadrant of right female breast ([place])   THERAPY DIAG:  Stiffness of right shoulder, not elsewhere classified  Aftercare following surgery for neoplasm  Abnormal posture  Malignant neoplasm of upper-outer quadrant of right breast in female, estrogen receptor positive ([place])  Rationale for Evaluation and Treatment: Rehabilitation  ONSET DATE: 05/02/22  SUBJECTIVE:  SUBJECTIVE STATEMENT: I have been having a lot of pain at the incision and it is red.   PERTINENT HISTORY:  Patient was diagnosed on 05/02/22 with right grade 1. It measures 5 x 4 x 5 mm and is located in the upper-outer quadrant. It is ER/PR+, Her2-. Ki67 not completed. 06/07/22: R breast lumpectomy SLNB 0/3    PATIENT GOALS:  Reassess how my recovery is going related to arm function, pain, and swelling.  PAIN:  Are you having pain? Yes: NPRS scale: 2/10 Pain location: R axilla Pain description: dull ache Aggravating factors: touching it  Relieving factors: ibuprofen  PRECAUTIONS: Recent Surgery, right UE Lymphedema risk,   ACTIVITY LEVEL / LEISURE: doing post op stretches, squeezing hand strengthening ball   OBJECTIVE:   PATIENT SURVEYS:  QUICK DASH:     OBSERVATIONS: R breast still with some post op edema, encouraged pt to wear her compression bra, cording visible in R axilla  POSTURE:  Forward head and rounded shoulders posture   UPPER EXTREMITY AROM/PROM:   A/PROM RIGHT   eval   RIGHT 06/25/22  Shoulder extension 63 75  Shoulder flexion 168 140  Shoulder abduction 173 147  Shoulder internal rotation 38 60   Shoulder external rotation 83 82                          (Blank rows = not tested)   A/PROM LEFT   eval  Shoulder extension 54  Shoulder flexion 166  Shoulder abduction 175  Shoulder internal rotation 38  Shoulder external rotation 82                          (Blank rows = not tested)   CERVICAL AROM: All within normal limits:      Percent limited  Flexion WFL  Extension WFL  Right lateral flexion WFL  Left lateral flexion WFL  Right rotation WFL  Left rotation WFL      UPPER EXTREMITY STRENGTH: 5/5   LYMPHEDEMA ASSESSMENTS:    LANDMARK RIGHT   eval RIGHT 06/25/22  10 cm proximal to olecranon process 31 31  Olecranon process 24.5 24.5  10 cm proximal to ulnar styloid process 21 20.5  Just proximal to ulnar styloid process 14.5 14.8  Across hand at thumb web space 19 19  At base of 2nd digit 5.6 5.8  (Blank rows = not tested)   LANDMARK LEFT   eval  10 cm proximal to olecranon process 31.1  Olecranon process 24.4  10 cm proximal to ulnar styloid process 21.1  Just proximal to ulnar styloid process 14  Across hand at thumb web space 19.3  At base of 2nd digit 5.5  (Blank rows = not tested)  Surgery type/Date: 06/07/22- R lumpectomy and SLNB Number of lymph nodes removed: 0/3 Current/past treatment (chemo, radiation, hormone therapy): waiting on oncotype test to determine chemo, will need 6 weeks of radiation, will need hormone therapy Other symptoms:  Heaviness/tightness Yes Pain Yes Pitting edema No Infections No Decreased scar mobility Yes Stemmer sign No  PATIENT EDUCATION:  Education details: ABC class, axillary cording, scar mobilization, importance of stretching  Person educated: Patient Education method: Explanation and Handouts Education comprehension: verbalized understanding  HOME EXERCISE PROGRAM: Reviewed previously given post op HEP.  TREATMENT PERFORMED TODAY:  06/27/22: Educated pt about signs of infection which pt presents with:  redness, pain, and body aches. Educated pt about a seroma  and to wear compression while she has a seroma and especially if it needs to be drained to help it heal. Reached out to her surgeon and she has an appointment tomorrow at [place]. Also reached out to her oncologist so that he was aware as well. Educated pt to hold on massage for now but try to do some stretching once her pain improves some.   06/25/22: MFR to cording in R axilla with gently ROM  ASSESSMENT:  CLINICAL IMPRESSION: Pt returns to PT today but has redness at incision, pain, and reports recent body aches. She also a palpable firmness in area of her scar which feels like it may be a seroma. Reached out to pt's surgeon who was not in the office this afternoon but she has an appointment to be seen tomorrow. No manual therapy performed due to possible infection. See treatment section for education provided.   Pt will benefit from skilled therapeutic intervention to improve on the following deficits: Decreased knowledge of precautions, impaired UE functional use, pain, decreased ROM, postural dysfunction.   PT treatment/interventions: ADL/Self care home management, Therapeutic exercises, Therapeutic activity, Patient/Family education, Self Care, Joint mobilization, Orthotic/Fit training, Manual lymph drainage, Compression bandaging, Vasopneumatic device, Manual therapy, and Re-evaluation   GOALS: Goals reviewed with patient? Yes  LONG TERM GOALS:  (STG=LTG)  GOALS Name Target Date  Goal status  1 Pt will demonstrate she has regained full shoulder ROM and function post operatively compared to baselines.  Baseline: 07/23/22 NEW  2 Pt will demonstrate 165 degrees of R shoulder flexion to allow her to reach overhead. 07/23/22 NEW  3 Pt will demonstrate 170 degrees of R shoulder abduction to allow her to reach out to the side. 07/23/22 NEW  4 Pt will report she is no longer having pain at end range from cording to allow improved comfort. 07/23/22  NEW  5 Pt will be independent in a home exercise program for continued strengthening and stretching.  07/23/22 NEW     PLAN:  PT FREQUENCY/DURATION: 2x/wk for 4 wks  PLAN FOR NEXT SESSION: what did dr say? pulleys, ball, MFR to cording R axilla, PROM R axilla   [facility]  [address], [address]  [place] [address]  [phone]  After Breast Cancer Class It is recommended you attend the ABC class to be educated on lymphedema risk reduction. This class is free of charge and lasts for 1 hour. It is a 1-time class. You will need to download the Webex app either on your phone or computer. We will send you a link the night before or the morning of the class. You should be able to click on that link to join the class. This is not a confidential class. You don't have to turn your camera on, but other participants may be able to see your email address.  Scar massage You can begin gentle scar massage to you incision sites. Gently place one hand on the incision and move the skin (without sliding on the skin) in various directions. Do this for a few minutes and then you can gently massage either coconut oil or vitamin E cream into the scars.  Compression garment You should continue wearing your compression bra until you feel like you no longer have swelling.  Home exercise Program Continue doing the exercises you were given until you feel like you can do them without feeling any tightness at the end.   Walking Program Studies show that 30 minutes of  walking per day (fast enough to elevate your heart rate) can significantly reduce the risk of a cancer recurrence. If you can't walk due to other medical reasons, we encourage you to find another activity you could do (like a stationary bike or water exercise).  Posture After breast cancer surgery, people frequently sit with rounded shoulders posture because it puts their incisions on slack and feels better. If you sit like  this and scar tissue forms in that position, you can become very tight and have pain sitting or standing with good posture. Try to be aware of your posture and sit and stand up tall to heal properly.  Follow up PT: It is recommended you return every 3 months for the first 3 years following surgery to be assessed on the SOZO machine for an L-Dex score. This helps prevent clinically significant lymphedema in 95% of patients. These follow up screens are 10 minute appointments that you are not billed for.  [facility] [place], PT 06/27/2022, 2:35 PM

## 2022-06-27 NOTE — Progress Notes (Signed)
Rhonda Charles and her physical therapist called concerned about possible infection in her incision and maybe a seroma developing.   She will be seeing surgeon in the morning.  They were wondering if she should be started on abx sooner then tomorrow, but Dr. Grayland Ormond said to defer to the surgeon since she will see him tomorrow.

## 2022-06-28 ENCOUNTER — Encounter: Payer: Self-pay | Admitting: *Deleted

## 2022-06-28 ENCOUNTER — Encounter: Payer: Self-pay | Admitting: Oncology

## 2022-06-28 NOTE — Progress Notes (Signed)
Oncotype Dx result 22, let patient know score and chemotherapy is not needed.

## 2022-07-01 ENCOUNTER — Ambulatory Visit: Payer: BC Managed Care – PPO | Admitting: Physical Therapy

## 2022-07-01 ENCOUNTER — Encounter: Payer: Self-pay | Admitting: Physical Therapy

## 2022-07-01 DIAGNOSIS — M25611 Stiffness of right shoulder, not elsewhere classified: Secondary | ICD-10-CM

## 2022-07-01 DIAGNOSIS — R293 Abnormal posture: Secondary | ICD-10-CM

## 2022-07-01 DIAGNOSIS — Z170 Estrogen receptor positive status [ER+]: Secondary | ICD-10-CM

## 2022-07-01 DIAGNOSIS — Z483 Aftercare following surgery for neoplasm: Secondary | ICD-10-CM

## 2022-07-01 NOTE — Therapy (Signed)
OUTPATIENT PHYSICAL THERAPY BREAST CANCER POST OP FOLLOW UP   Patient Name: Rhonda Charles MRN: [id number] DOB:05/04/1970, 52 y.o., female Today's Date: 07/01/2022  END OF SESSION:  PT End of Session - 07/01/22 1453     Visit Number 4    Number of Visits 10    Date for PT Re-Evaluation 07/23/22    PT Start Time [id number]    PT Stop Time 1452    PT Time Calculation (min) 48 min    Activity Tolerance Patient tolerated treatment well    Behavior During Therapy [facility] for tasks assessed/performed              PT End of Session - 06/25/22 1403     Visit Number 2    Number of Visits 10   Date for PT Re-Evaluation 07/23/22    PT Start Time [id number]    PT Stop Time 1448    PT Time Calculation (min) 46 min    Activity Tolerance Patient tolerated treatment well    Behavior During Therapy WFL for tasks assessed/performed             Past Medical History:  Diagnosis Date   Allergy    Seasonal   Anxiety    Hypertension    Past Surgical History:  Procedure Laterality Date   AXILLARY SENTINEL NODE BIOPSY Right 06/07/2022   Procedure: AXILLARY SENTINEL LYMPH NODE BIOPSY;  Surgeon: Rolm Bookbinder, MD;  Location: [place];  Service: General;  Laterality: Right;   BREAST BIOPSY Right 05/02/2022   US biopsy/ ribbon clip/ path pending   BREAST BIOPSY Right 05/02/2022   [place] RT BREAST BX W LOC DEV 1ST LESION IMG BX [place] US GUIDE 05/02/2022 [facility]   BREAST BIOPSY  06/04/2022   MM RT RADIOACTIVE SEED LOC MAMMO GUIDE 06/04/2022 [facility]   BREAST LUMPECTOMY WITH RADIOACTIVE SEED AND SENTINEL LYMPH NODE BIOPSY Right 06/07/2022   Procedure: RIGHT BREAST LUMPECTOMY WITH RADIOACTIVE SEED;  Surgeon: Rolm Bookbinder, MD;  Location: [place];  Service: General;  Laterality: Right;   COLONOSCOPY     WISDOM TOOTH EXTRACTION Bilateral    Patient Active Problem List   Diagnosis Date Noted   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[place]) 05/07/2022   Allergic rhinitis 01/21/2018    Anxiety 08/09/2014   Overweight (BMI 25.0-29.9) 08/09/2014    PCP: Wayland Denis PA-C  REFERRING PROVIDER: Rolm Bookbinder, MD  REFERRING DIAG: [phone] (ICD-10-CM) - Malignant neoplasm of upper-outer quadrant of right female breast ([place])   THERAPY DIAG:  Stiffness of right shoulder, not elsewhere classified  Aftercare following surgery for neoplasm  Abnormal posture  Malignant neoplasm of upper-outer quadrant of right breast in female, estrogen receptor positive ([place])  Rationale for Evaluation and Treatment: Rehabilitation  ONSET DATE: 05/02/22  SUBJECTIVE:  SUBJECTIVE STATEMENT: I am feeling better. It is not as painful but I still have a lot of discomfort in my shoulder.   PERTINENT HISTORY:  Patient was diagnosed on 05/02/22 with right grade 1. It measures 5 x 4 x 5 mm and is located in the upper-outer quadrant. It is ER/PR+, Her2-. Ki67 not completed. 06/07/22: R breast lumpectomy SLNB 0/3    PATIENT GOALS:  Reassess how my recovery is going related to arm function, pain, and swelling.  PAIN:  Are you having pain? Yes: NPRS scale: not rated today/10 Pain location: R posterior shoulder Pain description: tightness Aggravating factors: moving R UE Relieving factors: muscle relaxer  PRECAUTIONS: Recent Surgery, right UE Lymphedema risk,   ACTIVITY LEVEL / LEISURE: doing post op stretches, squeezing hand strengthening ball   OBJECTIVE:   PATIENT SURVEYS:  QUICK DASH:     OBSERVATIONS: R breast still with some post op edema, encouraged pt to wear her compression bra, cording visible in R axilla  POSTURE:  Forward head and rounded shoulders posture   UPPER EXTREMITY AROM/PROM:   A/PROM RIGHT   eval   RIGHT 06/25/22  Shoulder extension 63 75  Shoulder flexion 168 140   Shoulder abduction 173 147  Shoulder internal rotation 38 60  Shoulder external rotation 83 82                          (Blank rows = not tested)   A/PROM LEFT   eval  Shoulder extension 54  Shoulder flexion 166  Shoulder abduction 175  Shoulder internal rotation 38  Shoulder external rotation 82                          (Blank rows = not tested)   CERVICAL AROM: All within normal limits:      Percent limited  Flexion WFL  Extension WFL  Right lateral flexion WFL  Left lateral flexion WFL  Right rotation WFL  Left rotation WFL      UPPER EXTREMITY STRENGTH: 5/5   LYMPHEDEMA ASSESSMENTS:    LANDMARK RIGHT   eval RIGHT 06/25/22  10 cm proximal to olecranon process 31 31  Olecranon process 24.5 24.5  10 cm proximal to ulnar styloid process 21 20.5  Just proximal to ulnar styloid process 14.5 14.8  Across hand at thumb web space 19 19  At base of 2nd digit 5.6 5.8  (Blank rows = not tested)   LANDMARK LEFT   eval  10 cm proximal to olecranon process 31.1  Olecranon process 24.4  10 cm proximal to ulnar styloid process 21.1  Just proximal to ulnar styloid process 14  Across hand at thumb web space 19.3  At base of 2nd digit 5.5  (Blank rows = not tested)  Surgery type/Date: 06/07/22- R lumpectomy and SLNB Number of lymph nodes removed: 0/3 Current/past treatment (chemo, radiation, hormone therapy): waiting on oncotype test to determine chemo, will need 6 weeks of radiation, will need hormone therapy Other symptoms:  Heaviness/tightness Yes Pain Yes Pitting edema No Infections No Decreased scar mobility Yes Stemmer sign No  PATIENT EDUCATION:  Education details: ABC class, axillary cording, scar mobilization, importance of stretching  Person educated: Patient Education method: Explanation and Handouts Education comprehension: verbalized understanding  HOME EXERCISE PROGRAM: Reviewed previously given post op HEP.  TREATMENT PERFORMED  TODAY:  07/01/22: MFR to R axilla in area of cording, PROM in to flexion,  abduction and ER while performing MFR to axilla STM with cocoa butter in L s/l to R lats, rhomboids, serratus and levator to decrease tightness with numerous areas of muscle tightness at beginning that improved by end of session 06/27/22: Educated pt about signs of infection which pt presents with: redness, pain, and body aches. Educated pt about a seroma and to wear compression while she has a seroma and especially if it needs to be drained to help it heal. Reached out to her surgeon and she has an appointment tomorrow at [place]. Also reached out to her oncologist so that he was aware as well. Educated pt to hold on massage for now but try to do some stretching once her pain improves some.   06/25/22: MFR to cording in R axilla with gently ROM  ASSESSMENT:  CLINICAL IMPRESSION: Pt reports she went to her surgeon who did not think she had any infection. Her pain is improving but she is still tender to touch at the scar. There is still some redness inferior to her scar. Continued with MFR to cording today in R axilla with at least 2 cords palpable and PROM in all directions with full ROM achieved today. Also performed STM in sidelying today to R upper posterior quadrant where increased muscle tightness was palpable and improved by end of session.   Pt will benefit from skilled therapeutic intervention to improve on the following deficits: Decreased knowledge of precautions, impaired UE functional use, pain, decreased ROM, postural dysfunction.   PT treatment/interventions: ADL/Self care home management, Therapeutic exercises, Therapeutic activity, Patient/Family education, Self Care, Joint mobilization, Orthotic/Fit training, Manual lymph drainage, Compression bandaging, Vasopneumatic device, Manual therapy, and Re-evaluation   GOALS: Goals reviewed with patient? Yes  LONG TERM GOALS:  (STG=LTG)  GOALS Name Target Date  Goal  status  1 Pt will demonstrate she has regained full shoulder ROM and function post operatively compared to baselines.  Baseline: 07/23/22 NEW  2 Pt will demonstrate 165 degrees of R shoulder flexion to allow her to reach overhead. 07/23/22 NEW  3 Pt will demonstrate 170 degrees of R shoulder abduction to allow her to reach out to the side. 07/23/22 NEW  4 Pt will report she is no longer having pain at end range from cording to allow improved comfort. 07/23/22 NEW  5 Pt will be independent in a home exercise program for continued strengthening and stretching.  07/23/22 NEW     PLAN:  PT FREQUENCY/DURATION: 2x/wk for 4 wks  PLAN FOR NEXT SESSION: pulleys, ball, MFR to cording R axilla, PROM R axilla   [facility]  [address], [address]  [place] [address]  [phone]  After Breast Cancer Class It is recommended you attend the ABC class to be educated on lymphedema risk reduction. This class is free of charge and lasts for 1 hour. It is a 1-time class. You will need to download the Webex app either on your phone or computer. We will send you a link the night before or the morning of the class. You should be able to click on that link to join the class. This is not a confidential class. You don't have to turn your camera on, but other participants may be able to see your email address.  Scar massage You can begin gentle scar massage to you incision sites. Gently place one hand on the incision and move the skin (without sliding on the skin) in various directions. Do this for a few  minutes and then you can gently massage either coconut oil or vitamin E cream into the scars.  Compression garment You should continue wearing your compression bra until you feel like you no longer have swelling.  Home exercise Program Continue doing the exercises you were given until you feel like you can do them without feeling any tightness at the end.   Walking Program Studies show that  30 minutes of walking per day (fast enough to elevate your heart rate) can significantly reduce the risk of a cancer recurrence. If you can't walk due to other medical reasons, we encourage you to find another activity you could do (like a stationary bike or water exercise).  Posture After breast cancer surgery, people frequently sit with rounded shoulders posture because it puts their incisions on slack and feels better. If you sit like this and scar tissue forms in that position, you can become very tight and have pain sitting or standing with good posture. Try to be aware of your posture and sit and stand up tall to heal properly.  Follow up PT: It is recommended you return every 3 months for the first 3 years following surgery to be assessed on the SOZO machine for an L-Dex score. This helps prevent clinically significant lymphedema in 95% of patients. These follow up screens are 10 minute appointments that you are not billed for.  [facility], PT 07/01/2022, 3:00 PM

## 2022-07-02 ENCOUNTER — Ambulatory Visit: Payer: BC Managed Care – PPO | Admitting: Oncology

## 2022-07-02 ENCOUNTER — Institutional Professional Consult (permissible substitution): Payer: BC Managed Care – PPO | Admitting: Radiation Oncology

## 2022-07-03 ENCOUNTER — Ambulatory Visit
Admission: RE | Admit: 2022-07-03 | Discharge: 2022-07-03 | Disposition: A | Discharge status: Home / Self Care | Payer: BC Managed Care – PPO | Source: Ambulatory Visit | Attending: Radiation Oncology | Admitting: Radiation Oncology

## 2022-07-03 ENCOUNTER — Inpatient Hospital Stay: Payer: BC Managed Care – PPO | Attending: Oncology | Admitting: Oncology

## 2022-07-03 ENCOUNTER — Encounter: Payer: Self-pay | Admitting: Oncology

## 2022-07-03 ENCOUNTER — Encounter: Payer: Self-pay | Admitting: *Deleted

## 2022-07-03 VITALS — BP 130/81 | HR 76 | Temp 97.1°F | Resp 18 | Ht 63.0 in | Wt 164.1 lb

## 2022-07-03 DIAGNOSIS — Z170 Estrogen receptor positive status [ER+]: Secondary | ICD-10-CM | POA: Insufficient documentation

## 2022-07-03 DIAGNOSIS — I10 Essential (primary) hypertension: Secondary | ICD-10-CM | POA: Insufficient documentation

## 2022-07-03 DIAGNOSIS — F17210 Nicotine dependence, cigarettes, uncomplicated: Secondary | ICD-10-CM | POA: Insufficient documentation

## 2022-07-03 DIAGNOSIS — C50911 Malignant neoplasm of unspecified site of right female breast: Secondary | ICD-10-CM

## 2022-07-03 DIAGNOSIS — Z803 Family history of malignant neoplasm of breast: Secondary | ICD-10-CM | POA: Insufficient documentation

## 2022-07-03 DIAGNOSIS — C50411 Malignant neoplasm of upper-outer quadrant of right female breast: Secondary | ICD-10-CM | POA: Insufficient documentation

## 2022-07-03 DIAGNOSIS — Z79899 Other long term (current) drug therapy: Secondary | ICD-10-CM | POA: Insufficient documentation

## 2022-07-03 NOTE — Consult Note (Signed)
NEW PATIENT EVALUATION  Name: Rhonda Charles  MRN: [id number]  Date:   07/03/2022     DOB: 07-19-1970   This 52 y.o. female patient presents to the clinic for initial evaluation of stage Ia (T1 cN0 M0).  Invasive mammary carcinoma the right breast status post wide local excision and sentinel node biopsy ER/PR positive  REFERRING PHYSICIAN: Wayland Denis, PA-C  CHIEF COMPLAINT:  Chief Complaint  Patient presents with   Breast Cancer    Consult    DIAGNOSIS: The encounter diagnosis was Malignant neoplasm of upper-outer quadrant of right female breast, unspecified estrogen receptor status ([place]).   PREVIOUS INVESTIGATIONS:  Mammogram and ultrasound reviewed Pathology reports reviewed Clinical notes reviewed  HPI: Patient is a 52 year old female who presented with an abnormal mammogram of the right breast showing suspicious 5 mm mass in the right breast at 11 o'clock position.  By ultrasound no evidence of axillary adenopathy was noted.  She underwent ultrasound-guided biopsy which was positive for invasive mammary carcinoma.  She then underwent a wide local excision and sentinel node biopsy for 1.3 cm grade 1 invasive mammary carcinoma with glandular (acinar) tubular differentiation.  Margins were clear and she had reexcision of the posterior margin which was free of tumor at least 3 mm.  3 sentinel lymph nodes were examined all negative for metastatic disease.  Tumor was strongly ER/PR positive HER2/neu not overexpressed.  Oncotype Dx showed a score of 22 and she was not recommended for systemic treatment.  She is seen today for radiation oncology opinion she is doing well.  Her major complaint is some pain and tenderness in her right axilla which we would expect from her surgery.  She otherwise is without complaint.  PLANNED TREATMENT REGIMEN: Partial breast irradiation  PAST MEDICAL HISTORY:  has a past medical history of Allergy, Anxiety, and Hypertension.    PAST SURGICAL  HISTORY:  Past Surgical History:  Procedure Laterality Date   AXILLARY SENTINEL NODE BIOPSY Right 06/07/2022   Procedure: AXILLARY SENTINEL LYMPH NODE BIOPSY;  Surgeon: Rolm Bookbinder, MD;  Location: [place];  Service: General;  Laterality: Right;   BREAST BIOPSY Right 05/02/2022   US biopsy/ ribbon clip/ path pending   BREAST BIOPSY Right 05/02/2022   [place] RT BREAST BX W LOC DEV 1ST LESION IMG BX [place] US GUIDE 05/02/2022 [facility]   BREAST BIOPSY  06/04/2022   MM RT RADIOACTIVE SEED LOC MAMMO GUIDE 06/04/2022 [facility]   BREAST LUMPECTOMY WITH RADIOACTIVE SEED AND SENTINEL LYMPH NODE BIOPSY Right 06/07/2022   Procedure: RIGHT BREAST LUMPECTOMY WITH RADIOACTIVE SEED;  Surgeon: Rolm Bookbinder, MD;  Location: [place];  Service: General;  Laterality: Right;   COLONOSCOPY     WISDOM TOOTH EXTRACTION Bilateral     FAMILY HISTORY: family history includes Anxiety disorder in her mother; Arthritis in her mother; Breast cancer (age of onset: 72) in her maternal grandmother; Breast cancer (age of onset: 72) in her mother; Cancer in her maternal grandmother and mother; Diabetes in her mother; Varicose Veins in her maternal grandmother.  SOCIAL HISTORY:  reports that she has been smoking cigarettes. She has a 3.75 pack-year smoking history. She does not have any smokeless tobacco history on file. She reports current alcohol use of about 1.0 standard drink of alcohol per week. She reports that she does not use drugs.  ALLERGIES: Patient has no known allergies.  MEDICATIONS:  Current Outpatient Medications  Medication Sig Dispense Refill   acetaminophen (TYLENOL) 325 MG tablet Take 650 mg  by mouth every 6 (six) hours as needed for moderate pain.     b complex vitamins capsule Take 1 capsule by mouth daily.     cetirizine (ZYRTEC) 10 MG tablet Take 10 mg by mouth daily.     Cholecalciferol (VITAMIN D-1000 MAX ST) 25 MCG (1000 UT) tablet Take 1,000 Units by mouth daily.     clonazePAM  (KLONOPIN) 0.5 MG tablet Take 0.5 mg by mouth daily as needed for anxiety.     escitalopram (LEXAPRO) 20 MG tablet Take 20 mg by mouth daily.     fluticasone (FLONASE) 50 MCG/ACT nasal spray Place 2 sprays into both nostrils daily as needed for allergies.     methocarbamol (ROBAXIN) 500 MG tablet Take by mouth.     montelukast (SINGULAIR) 10 MG tablet Take 10 mg by mouth at bedtime.     nicotine (NICODERM CQ - DOSED IN MG/24 HOURS) 14 mg/24hr patch Place 14 mg onto the skin daily.     Norgestimate-Ethinyl Estradiol Triphasic 0.18/0.215/0.25 MG-35 MCG tablet Take 1 tablet by mouth daily.     propranolol (INDERAL) 20 MG tablet Take 20 mg by mouth daily.     No current facility-administered medications for this encounter.    ECOG PERFORMANCE STATUS:  0 - Asymptomatic  REVIEW OF SYSTEMS: Patient denies any weight loss, fatigue, weakness, fever, chills or night sweats. Patient denies any loss of vision, blurred vision. Patient denies any ringing  of the ears or hearing loss. No irregular heartbeat. Patient denies heart murmur or history of fainting. Patient denies any chest pain or pain radiating to her upper extremities. Patient denies any shortness of breath, difficulty breathing at night, cough or hemoptysis. Patient denies any swelling in the lower legs. Patient denies any nausea vomiting, vomiting of blood, or coffee ground material in the vomitus. Patient denies any stomach pain. Patient states has had normal bowel movements no significant constipation or diarrhea. Patient denies any dysuria, hematuria or significant nocturia. Patient denies any problems walking, swelling in the joints or loss of balance. Patient denies any skin changes, loss of hair or loss of weight. Patient denies any excessive worrying or anxiety or significant depression. Patient denies any problems with insomnia. Patient denies excessive thirst, polyuria, polydipsia. Patient denies any swollen glands, patient denies easy  bruising or easy bleeding. Patient denies any recent infections, allergies or URI. Patient "s visual fields have not changed significantly in recent time.   PHYSICAL EXAM: There were no vitals taken for this visit.  Patient is  large pendulous breasts. She status post wide local excision of the right breast incision is well-healed.  No dominant masses noted in either breast.  No axillary or supraclavicular adenopathy is appreciated.  Well-developed well-nourished patient in NAD. HEENT reveals PERLA, EOMI, discs not visualized.  Oral cavity is clear. No oral mucosal lesions are identified. Neck is clear without evidence of cervical or supraclavicular adenopathy. Lungs are clear to A&P. Cardiac examination is essentially unremarkable with regular rate and rhythm without murmur rub or thrill. Abdomen is benign with no organomegaly or masses noted. Motor sensory and DTR levels are equal and symmetric in the upper and lower extremities. Cranial nerves II through XII are grossly intact. Proprioception is intact. No peripheral adenopathy or edema is identified. No motor or sensory levels are noted. Crude visual fields are within normal range.  LABORATORY DATA: Reports reviewed    RADIOLOGY RESULTS: Mammogram and ultrasound reviewed compatible with above-stated findings   IMPRESSION: Age 1A ER/PR positive  invasive mammary carcinoma the right breast status post wide local excision and sentinel node biopsy in 52 year old female  PLAN: At this time based on the patient's breast size low-grade histology of her tumor I have recommended partial breast irradiation..  Would plan on delivering 35 Gray in 10 fractions using IMRT treatment planning and delivery.  Risks and benefits of treatment including skin reaction fatigue slight thickening of the breast tissue all were described in detail to the patient.  I personally set up and ordered CT simulation for early next week.  Patient also will benefit from endocrine  therapy after completion of radiation.  Patient husband both comprehend my treatment plan well.  I would like to take this opportunity to thank you for allowing me to participate in the care of your patient.Noreene Filbert, MD

## 2022-07-03 NOTE — Progress Notes (Signed)
No concerns for the provider. 

## 2022-07-03 NOTE — Progress Notes (Signed)
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  Oncotype score Dx 22, low risk.  INTERVAL HISTORY: Patient returns to clinic today to discuss her final pathology results and treatment planning.  She underwent lumpectomy approximately 1 month ago and tolerated her procedure well.  She currently feels well and is asymptomatic. She has no neurologic complaints.  She denies any recent fevers or illnesses.  She has a good appetite and denies weight loss.  She has no chest pain, shortness of breath, cough, or hemoptysis.  She denies any nausea, vomiting, constipation, or diarrhea.  She has no urinary complaints.  Patient offers no specific complaints today.  REVIEW OF SYSTEMS:   Review of Systems  Constitutional: Negative.  Negative for fever, malaise/fatigue and weight loss.  Respiratory: Negative.  Negative for cough, hemoptysis and shortness of breath.   Cardiovascular: Negative.  Negative for chest pain and leg swelling.  Gastrointestinal: Negative.  Negative for abdominal pain.  Genitourinary: Negative.  Negative for dysuria.  Musculoskeletal: Negative.  Negative for back pain.  Skin: Negative.  Negative for rash.  Neurological: Negative.  Negative for dizziness, focal weakness, weakness and headaches.  Psychiatric/Behavioral: Negative.  The patient is not nervous/anxious.     As per HPI. Otherwise, a complete review of systems is negative.  PAST MEDICAL HISTORY: Past Medical History:  Diagnosis Date   Allergy    Seasonal   Anxiety    Hypertension     PAST SURGICAL HISTORY: Past Surgical History:  Procedure Laterality Date   AXILLARY SENTINEL NODE  BIOPSY Right 06/07/2022   Procedure: AXILLARY SENTINEL LYMPH NODE BIOPSY;  Surgeon: Rolm Bookbinder, MD;  Location: [facility];  Service: General;  Laterality: Right;   BREAST BIOPSY Right 05/02/2022   US biopsy/ ribbon clip/ path pending   BREAST BIOPSY Right 05/02/2022   [place] RT BREAST BX W LOC DEV 1ST LESION IMG BX SPEC US GUIDE 05/02/2022 [facility]   BREAST BIOPSY  06/04/2022   MM RT RADIOACTIVE SEED LOC MAMMO GUIDE 06/04/2022 [facility]   BREAST LUMPECTOMY WITH RADIOACTIVE SEED AND SENTINEL LYMPH NODE BIOPSY Right 06/07/2022   Procedure: RIGHT BREAST LUMPECTOMY WITH RADIOACTIVE SEED;  Surgeon: Rolm Bookbinder, MD;  Location: [facility];  Service: General;  Laterality: Right;   COLONOSCOPY     WISDOM TOOTH EXTRACTION Bilateral     FAMILY HISTORY: Family History  Problem Relation Age of Onset   Breast cancer Mother 9   Anxiety disorder Mother    Arthritis Mother    Cancer Mother    Diabetes Mother    Breast cancer Maternal Grandmother 76   Cancer Maternal Grandmother    Varicose Veins Maternal Grandmother     ADVANCED DIRECTIVES (Y/N):  N  HEALTH MAINTENANCE: Social History   Tobacco Use   Smoking status: Every Day    Packs/day: 0.25    Years: 15.00    Total pack years: 3.75    Types: Cigarettes   Tobacco comments:    In a program  Substance Use Topics   Alcohol use: Yes    Alcohol/week: 1.0 standard drink of alcohol    Types: 1 Cans of beer per week   Drug use: Never     Colonoscopy:  PAP:  Bone density:  Lipid panel:  No Known Allergies  Current Outpatient Medications  Medication Sig Dispense Refill   acetaminophen (TYLENOL) 325 MG tablet Take 650 mg by mouth every 6 (six) hours as needed for moderate pain.     b complex vitamins capsule Take 1 capsule by mouth daily.     cetirizine (ZYRTEC) 10 MG tablet Take 10 mg by mouth daily.     Cholecalciferol (VITAMIN D-1000 MAX ST) 25 MCG (1000 UT) tablet Take 1,000 Units by mouth daily.     clonazePAM  (KLONOPIN) 0.5 MG tablet Take 0.5 mg by mouth daily as needed for anxiety.     escitalopram (LEXAPRO) 20 MG tablet Take 20 mg by mouth daily.     fluticasone (FLONASE) 50 MCG/ACT nasal spray Place 2 sprays into both nostrils daily as needed for allergies.     methocarbamol (ROBAXIN) 500 MG tablet Take by mouth.     montelukast (SINGULAIR) 10 MG tablet Take 10 mg by mouth at bedtime.     nicotine (NICODERM CQ - DOSED IN MG/24 HOURS) 14 mg/24hr patch Place 14 mg onto the skin daily.     Norgestimate-Ethinyl Estradiol Triphasic 0.18/0.215/0.25 MG-35 MCG tablet Take 1 tablet by mouth daily.     propranolol (INDERAL) 20 MG tablet Take 20 mg by mouth daily.     No current facility-administered medications for this visit.    OBJECTIVE: Vitals:   07/03/22 0947  BP: 130/81  Pulse: 76  Resp: 18  Temp: (!) 97.1 F (36.2 C)  SpO2: 100%     Body mass index is 29.07 kg/m.    ECOG FS:0 - Asymptomatic  General: Well-developed, well-nourished, no acute distress. Eyes: Pink conjunctiva, anicteric sclera. HEENT: Normocephalic, moist mucous membranes. Lungs: No audible wheezing or coughing. Heart: Regular rate and rhythm. Abdomen: Soft, nontender, no obvious distention. Musculoskeletal: No edema, cyanosis, or clubbing. Neuro: Alert, answering all questions appropriately. Cranial nerves grossly intact. Skin: No rashes or petechiae noted. Psych: Normal affect.  LAB RESULTS:  Lab Results  Component Value Date   NA 137 05/31/2022   K 4.0 05/31/2022   CL 101 05/31/2022   CO2 26 05/31/2022   GLUCOSE 102 (H) 05/31/2022   BUN 7 05/31/2022   CREATININE 0.80 05/31/2022   CALCIUM 9.2 05/31/2022   GFRNONAA >60 05/31/2022    Lab Results  Component Value Date   WBC 9.3 05/31/2022   HGB 15.3 (H) 05/31/2022   HCT 46.6 (H) 05/31/2022   MCV 94.0 05/31/2022   PLT 361 05/31/2022     STUDIES: MM Breast Surgical Specimen  Result Date: 06/07/2022 CLINICAL DATA:  Post right breast lumpectomy.  EXAM: SPECIMEN RADIOGRAPH OF THE RIGHT BREAST COMPARISON:  Previous exam(s). FINDINGS: Status post excision of the right breast. The radioactive seed and adjacent ribbon shaped biopsy marker clip are present, completely intact, and were marked for pathology. IMPRESSION: Specimen radiograph of the right breast. Electronically Signed   By: Everlean Alstrom M.D.   On: 06/07/2022 08:26  MM RT RADIOACTIVE SEED LOC MAMMO GUIDE  Result Date: 06/04/2022 CLINICAL DATA:  52 year old female presenting for seed localization of the right breast. Newly diagnosed right breast invasive mammary carcinoma. EXAM: MAMMOGRAPHIC GUIDED RADIOACTIVE SEED LOCALIZATION OF THE RIGHT BREAST COMPARISON:  Previous exam(s). FINDINGS: Patient presents for radioactive seed localization prior to right breast lumpectomy. I met with the patient and we discussed the procedure of seed localization including benefits and alternatives. We discussed the high likelihood of a successful procedure. We  discussed the risks of the procedure including infection, bleeding, tissue injury and further surgery. We discussed the low dose of radioactivity involved in the procedure. Informed, written consent was given. The usual time-out protocol was performed immediately prior to the procedure. Using mammographic guidance, sterile technique, 1% lidocaine and an I-125 radioactive seed, the ribbon shaped biopsy marking clip in the right breast was localized using a lateral approach. The follow-up mammogram images confirm the seed in the expected location and were marked for Dr. Donne Hazel. Follow-up survey of the patient confirms presence of the radioactive seed. Order number of I-125 seed:  [id number]:[id number]. Total activity: 0.248 mCi reference Date: May 22, 2022 The patient tolerated the procedure well and was released from the [place]. She was given instructions regarding seed removal. IMPRESSION: Radioactive seed localization right breast. No apparent  complications. Electronically Signed   By: Audie Pinto M.D.   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  Oncotype score Dx 22, low risk.  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  Given her low risk Oncotype Dx score of 22, adjuvant chemotherapy is not necessary.  Patient will benefit from from adjuvant XRT and has an appointment with radiation oncology later today.  Given the ER/PR positivity of her malignancy, she will also benefit from an aromatase inhibitor at the conclusion of her radiation treatments.  Return to clinic in approximately 6 to 8 weeks for further evaluation and initiation of letrozole.   I spent a total of 30 minutes reviewing chart data, face-to-face evaluation with the patient, counseling and coordination of care as detailed above.   Patient expressed understanding and was in agreement with this plan. She also understands that She can call clinic at any time with any questions, concerns, or complaints.    Cancer Staging  Invasive ductal carcinoma of right breast [facility]) Staging form: Breast, AJCC 8th Edition - Pathologic stage from 07/03/2022: Stage IA (pT1c, pN0, cM0, G1, ER+, PR+, HER2-, Oncotype DX score: 22) - Signed by Lloyd Huger, MD on 07/03/2022 Stage prefix: Initial diagnosis Multigene prognostic tests performed: Oncotype DX Recurrence score range: Greater than or equal to 11 Histologic grading system: 3 grade system   Lloyd Huger, MD   07/03/2022 10:35 AM

## 2022-07-03 NOTE — Progress Notes (Signed)
Met with patient and husband at medical oncology follow up.  She is doing well.  She would like to switch her therapy for her cording to [place], discussed with Marisue Humble and a referral has been entered.

## 2022-07-06 IMAGING — MG MM DIGITAL SCREENING BILAT W/ TOMO AND CAD
8 series · 8 of 24 positions shown · non-contrast
Comparison: Previous exam(s).

CLINICAL DATA: Screening.

EXAM:
DIGITAL SCREENING BILATERAL MAMMOGRAM WITH TOMOSYNTHESIS AND CAD
TECHNIQUE: Bilateral screening digital craniocaudal and mediolateral oblique
mammograms were obtained. Bilateral screening digital breast
tomosynthesis was performed. The images were evaluated with
computer-aided detection.

[R CC synth-2D]
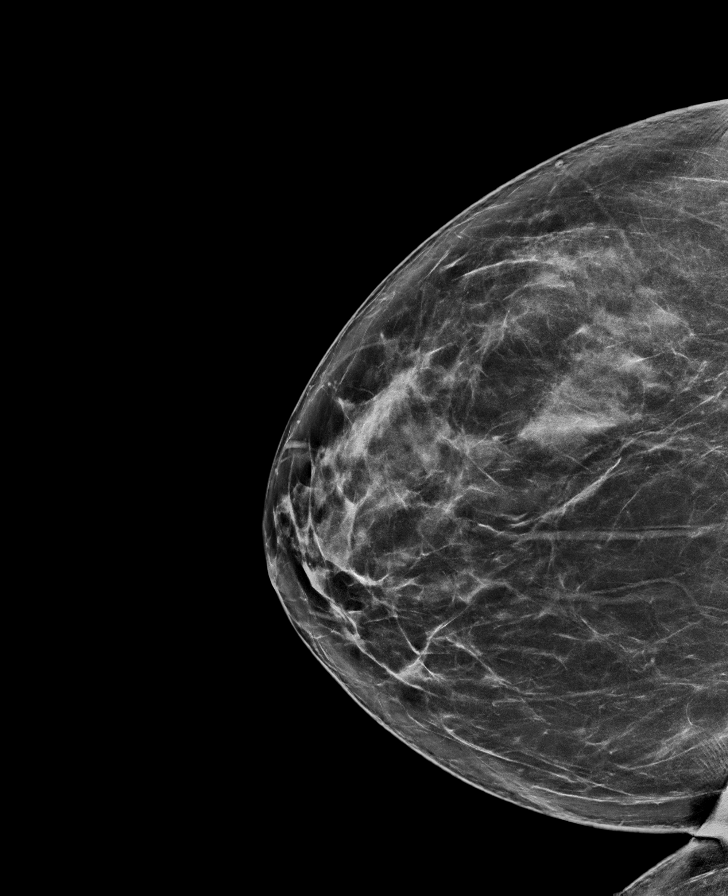

[L CC synth-2D]
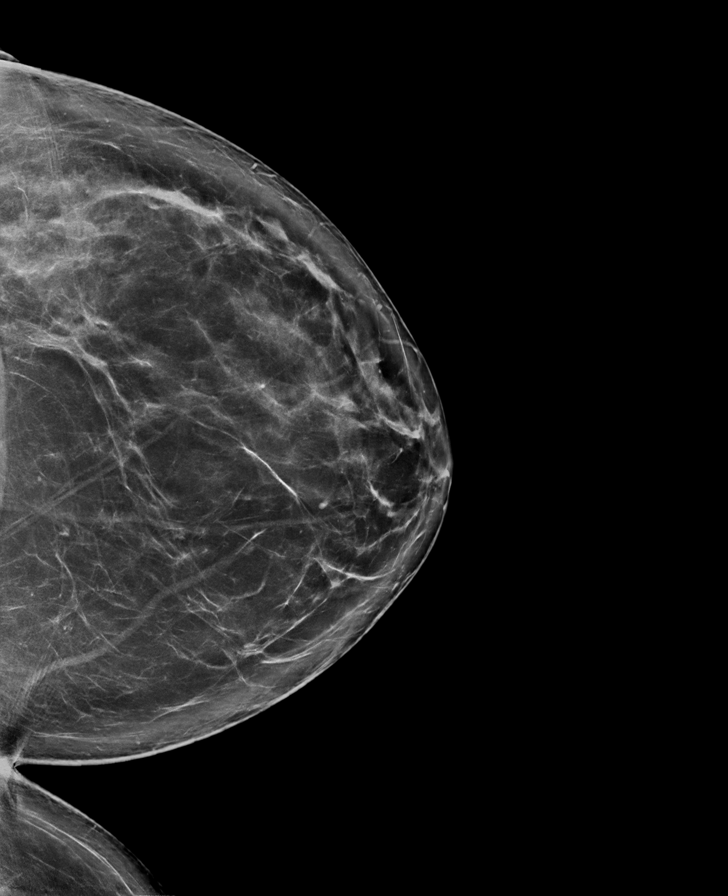

[R MLO synth-2D]
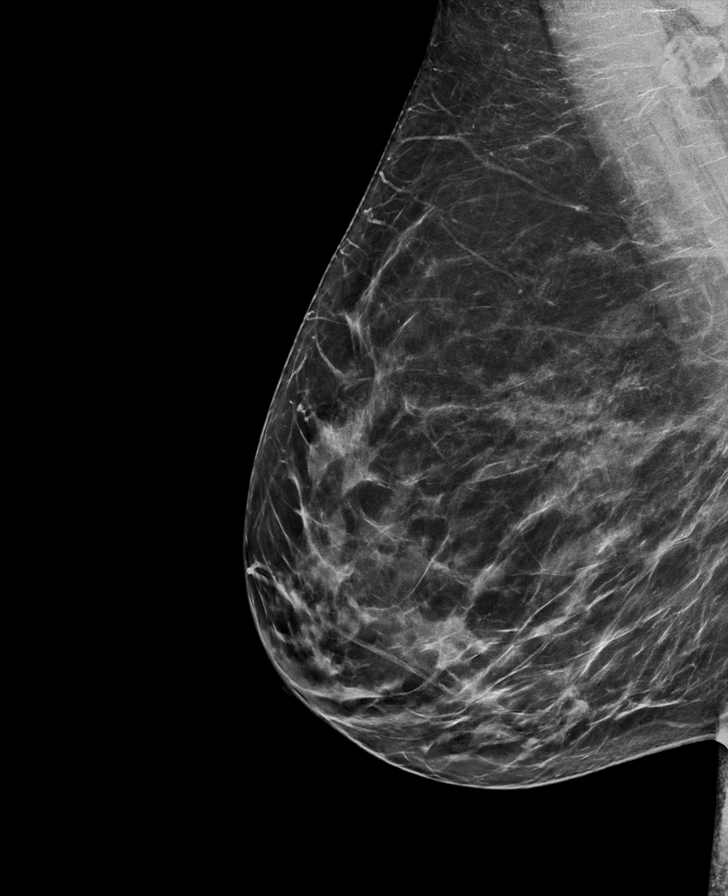

[L MLO synth-2D]
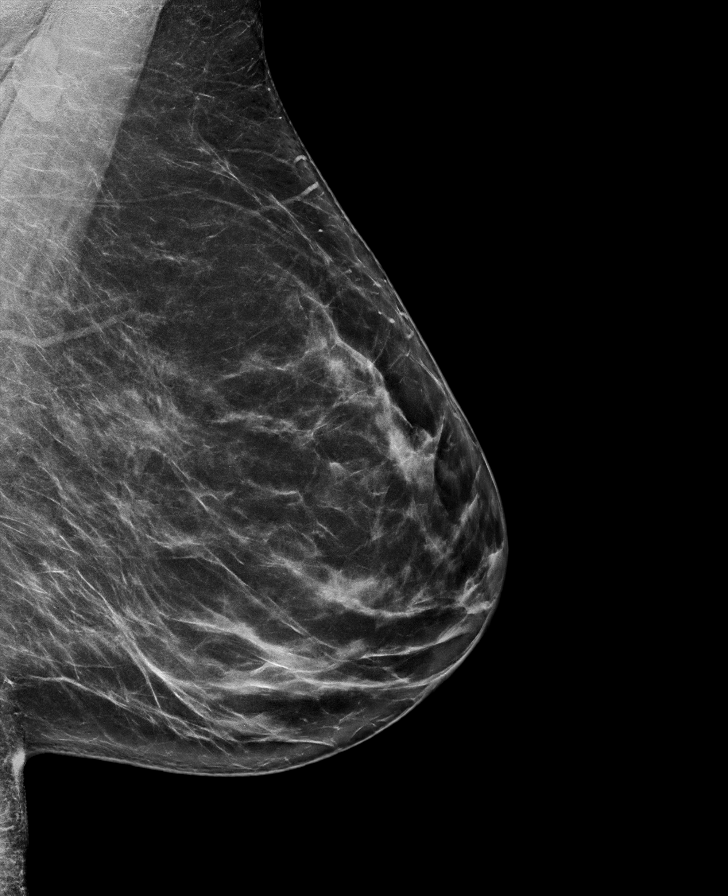

[L CC tomo · tomo slice 51/100.0]
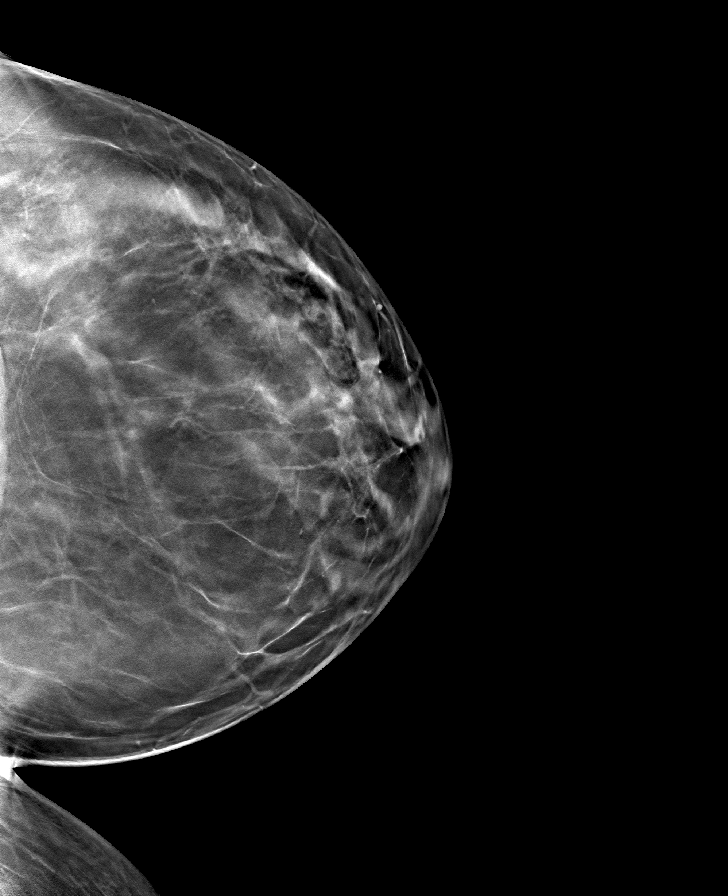

[R CC tomo · tomo slice 43/84.0]
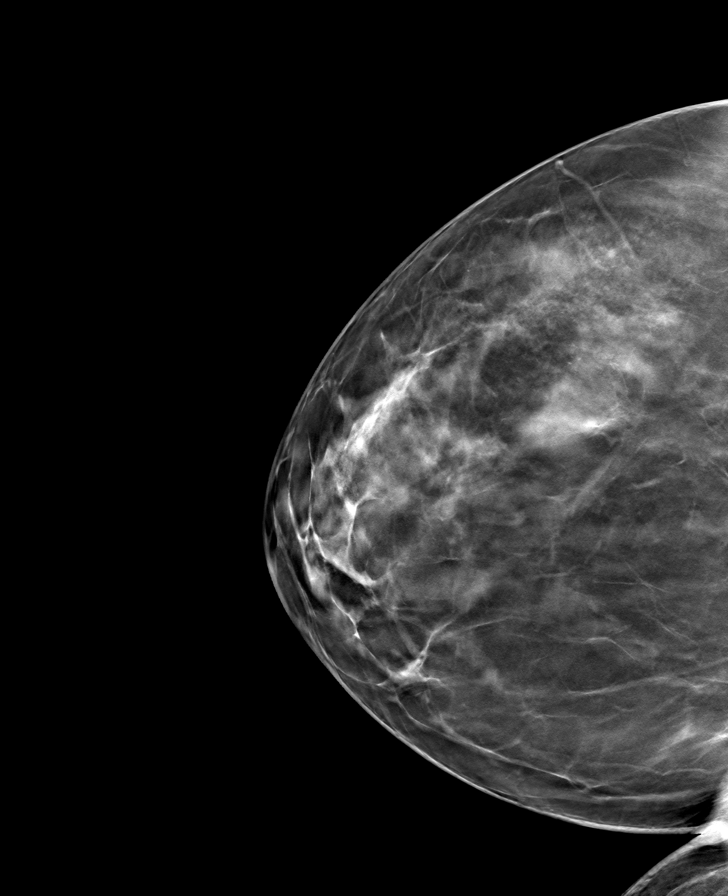

[L MLO tomo · tomo slice 42/83.0]
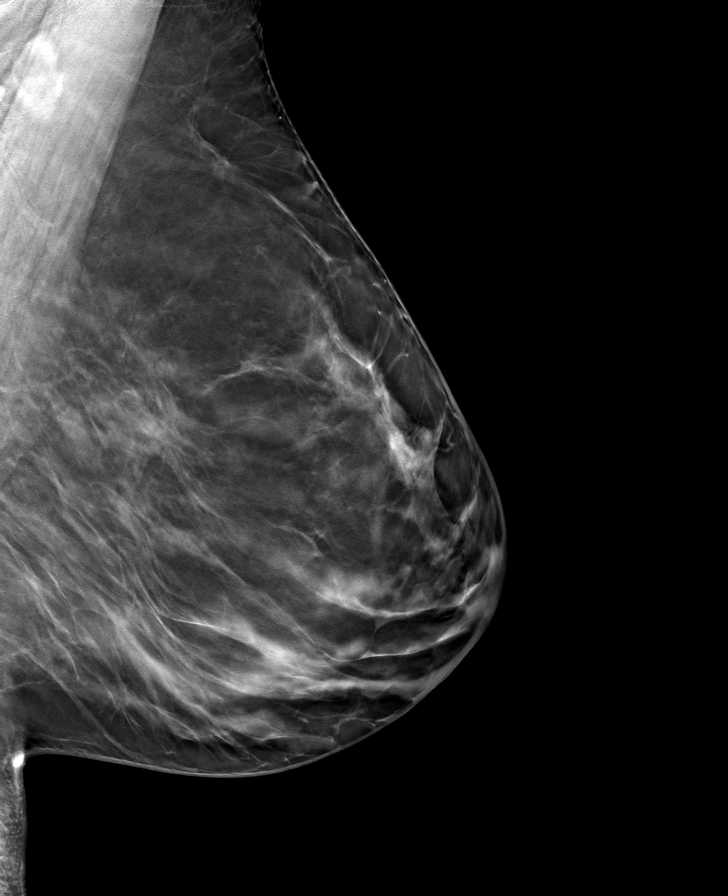

[R MLO tomo · tomo slice 41/82.0]
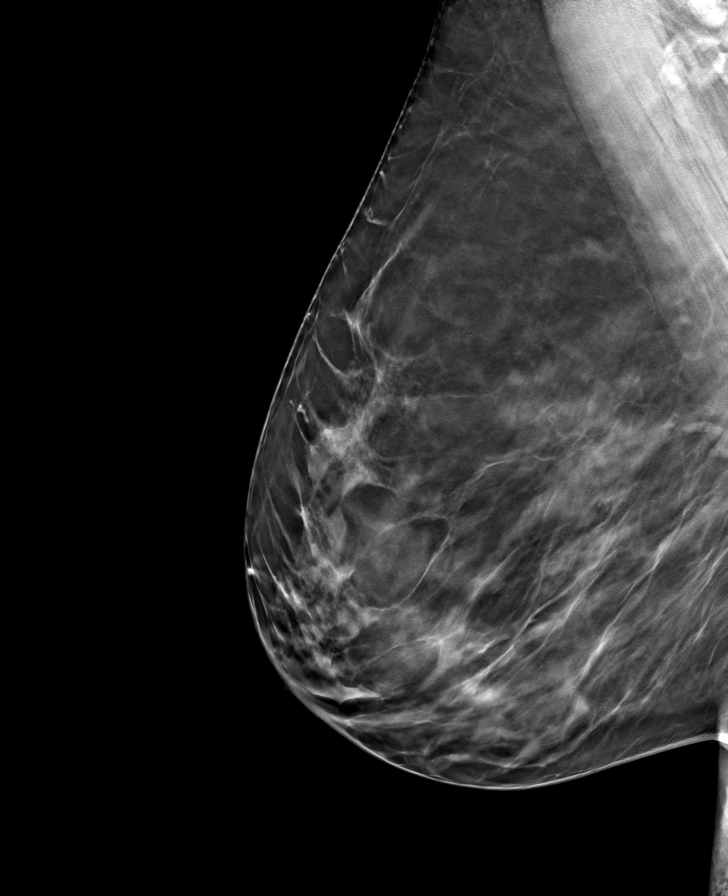

[8 of 24 positions shown; findings below may reference images not displayed]

ACR Breast Density Category c: The breast tissue is heterogeneously
dense, which may obscure small masses.
FINDINGS: There are no findings suspicious for malignancy.
IMPRESSION: No mammographic evidence of malignancy. A result letter of this
screening mammogram will be mailed directly to the patient.

RECOMMENDATION:
Screening mammogram in one year. (Code:Q3-W-BC3)

BI-RADS CATEGORY  1: Negative.

## 2022-07-08 ENCOUNTER — Ambulatory Visit: Payer: BC Managed Care – PPO | Admitting: Physical Therapy

## 2022-07-08 ENCOUNTER — Encounter: Payer: Self-pay | Admitting: Physical Therapy

## 2022-07-08 DIAGNOSIS — C50411 Malignant neoplasm of upper-outer quadrant of right female breast: Secondary | ICD-10-CM

## 2022-07-08 DIAGNOSIS — R293 Abnormal posture: Secondary | ICD-10-CM

## 2022-07-08 DIAGNOSIS — M25611 Stiffness of right shoulder, not elsewhere classified: Secondary | ICD-10-CM

## 2022-07-08 DIAGNOSIS — Z483 Aftercare following surgery for neoplasm: Secondary | ICD-10-CM

## 2022-07-08 NOTE — Patient Instructions (Signed)
Over Head Pull: Narrow and Wide Grip   Cancer Rehab [phone]   On back, knees bent, feet flat, band across thighs, elbows straight but relaxed. Pull hands apart (start). Keeping elbows straight, bring arms up and over head, hands toward floor. Keep pull steady on band. Hold momentarily. Return slowly, keeping pull steady, back to start. Then do same with a wider grip on the band (past shoulder width) Repeat _10__ times. Band color __yellow____   Side Pull: Double Arm   On back, knees bent, feet flat. Arms perpendicular to body, shoulder level, elbows straight but relaxed. Pull arms out to sides, elbows straight. Resistance band comes across collarbones, hands toward floor. Hold momentarily. Slowly return to starting position. Repeat _10__ times. Band color _yellow____   Sword   On back, knees bent, feet flat, left hand on left hip, right hand above left. Pull right arm DIAGONALLY (hip to shoulder) across chest. Bring right arm along head toward floor. Thumb is pointed down when by opposite hip and rotates backwards towards the floor when by head. Hold momentarily. Slowly return to starting position. Repeat _10__ times. Do with left arm. Band color _yellow_____   Shoulder Rotation: Double Arm   On back, knees bent, feet flat, elbows tucked at sides, bent 90, hands palms up. Pull hands apart and down toward floor, keeping elbows near sides. Hold momentarily. Slowly return to starting position. Repeat _10__ times. Band color __yellow____

## 2022-07-08 NOTE — Therapy (Signed)
OUTPATIENT PHYSICAL THERAPY BREAST CANCER POST OP FOLLOW UP   Patient Name: Rhonda Charles MRN: [id number] DOB:22-Nov-1970, 52 y.o., female Today's Date: 07/08/2022  END OF SESSION:  PT End of Session - 07/08/22 1402     Visit Number 5    Number of Visits 10    Date for PT Re-Evaluation 07/23/22    PT Start Time 1401    PT Stop Time 1455    PT Time Calculation (min) 54 min    Activity Tolerance Patient tolerated treatment well    Behavior During Therapy [facility] for tasks assessed/performed              PT End of Session - 06/25/22 1403     Visit Number 2    Number of Visits 10   Date for PT Re-Evaluation 07/23/22    PT Start Time [id number]    PT Stop Time 1448    PT Time Calculation (min) 46 min    Activity Tolerance Patient tolerated treatment well    Behavior During Therapy WFL for tasks assessed/performed             Past Medical History:  Diagnosis Date   Allergy    Seasonal   Anxiety    Hypertension    Past Surgical History:  Procedure Laterality Date   AXILLARY SENTINEL NODE BIOPSY Right 06/07/2022   Procedure: AXILLARY SENTINEL LYMPH NODE BIOPSY;  Surgeon: Rolm Bookbinder, MD;  Location: [place];  Service: General;  Laterality: Right;   BREAST BIOPSY Right 05/02/2022   US biopsy/ ribbon clip/ path pending   BREAST BIOPSY Right 05/02/2022   [place] RT BREAST BX W LOC DEV 1ST LESION IMG BX [place] US GUIDE 05/02/2022 [facility]   BREAST BIOPSY  06/04/2022   MM RT RADIOACTIVE SEED LOC MAMMO GUIDE 06/04/2022 [facility]   BREAST LUMPECTOMY WITH RADIOACTIVE SEED AND SENTINEL LYMPH NODE BIOPSY Right 06/07/2022   Procedure: RIGHT BREAST LUMPECTOMY WITH RADIOACTIVE SEED;  Surgeon: Rolm Bookbinder, MD;  Location: [facility];  Service: General;  Laterality: Right;   COLONOSCOPY     WISDOM TOOTH EXTRACTION Bilateral    Patient Active Problem List   Diagnosis Date Noted   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[place]) 05/07/2022   Allergic rhinitis 01/21/2018    Anxiety 08/09/2014   Overweight (BMI 25.0-29.9) 08/09/2014    PCP: Wayland Denis PA-C  REFERRING PROVIDER: Rolm Bookbinder, MD  REFERRING DIAG: [phone] (ICD-10-CM) - Malignant neoplasm of upper-outer quadrant of right female breast ([place])   THERAPY DIAG:  Stiffness of right shoulder, not elsewhere classified  Aftercare following surgery for neoplasm  Abnormal posture  Malignant neoplasm of upper-outer quadrant of right breast in female, estrogen receptor positive ([place])  Rationale for Evaluation and Treatment: Rehabilitation  ONSET DATE: 05/02/22  SUBJECTIVE:  SUBJECTIVE STATEMENT: I think things are getting better. I am still having that discomfort in my back.   PERTINENT HISTORY:  Patient was diagnosed on 05/02/22 with right grade 1. It measures 5 x 4 x 5 mm and is located in the upper-outer quadrant. It is ER/PR+, Her2-. Ki67 not completed. 06/07/22: R breast lumpectomy SLNB 0/3    PATIENT GOALS:  Reassess how my recovery is going related to arm function, pain, and swelling.  PAIN:  Are you having pain? Yes: NPRS scale: 3/10 Pain location: R axilla Pain description: soreness, tightness Aggravating factors: moving R UE Relieving factors: massage  PRECAUTIONS: Recent Surgery, right UE Lymphedema risk,   ACTIVITY LEVEL / LEISURE: doing post op stretches, squeezing hand strengthening ball   OBJECTIVE:   PATIENT SURVEYS:  QUICK DASH:     OBSERVATIONS: R breast still with some post op edema, encouraged pt to wear her compression bra, cording visible in R axilla  POSTURE:  Forward head and rounded shoulders posture   UPPER EXTREMITY AROM/PROM:   A/PROM RIGHT   eval   RIGHT 06/25/22 RIGHT 07/08/22  Shoulder extension 63 75   Shoulder flexion 168 140 164  Shoulder abduction 173  147 165  Shoulder internal rotation 38 60   Shoulder external rotation 83 82                           (Blank rows = not tested)   A/PROM LEFT   eval  Shoulder extension 54  Shoulder flexion 166  Shoulder abduction 175  Shoulder internal rotation 38  Shoulder external rotation 82                          (Blank rows = not tested)   CERVICAL AROM: All within normal limits:      Percent limited  Flexion WFL  Extension WFL  Right lateral flexion WFL  Left lateral flexion WFL  Right rotation WFL  Left rotation WFL      UPPER EXTREMITY STRENGTH: 5/5   LYMPHEDEMA ASSESSMENTS:    LANDMARK RIGHT   eval RIGHT 06/25/22  10 cm proximal to olecranon process 31 31  Olecranon process 24.5 24.5  10 cm proximal to ulnar styloid process 21 20.5  Just proximal to ulnar styloid process 14.5 14.8  Across hand at thumb web space 19 19  At base of 2nd digit 5.6 5.8  (Blank rows = not tested)   LANDMARK LEFT   eval  10 cm proximal to olecranon process 31.1  Olecranon process 24.4  10 cm proximal to ulnar styloid process 21.1  Just proximal to ulnar styloid process 14  Across hand at thumb web space 19.3  At base of 2nd digit 5.5  (Blank rows = not tested)  Surgery type/Date: 06/07/22- R lumpectomy and SLNB Number of lymph nodes removed: 0/3 Current/past treatment (chemo, radiation, hormone therapy): waiting on oncotype test to determine chemo, will need 6 weeks of radiation, will need hormone therapy Other symptoms:  Heaviness/tightness Yes Pain Yes Pitting edema No Infections No Decreased scar mobility Yes Stemmer sign No  PATIENT EDUCATION:  Education details: ABC class, axillary cording, scar mobilization, importance of stretching  Person educated: Patient Education method: Explanation and Handouts Education comprehension: verbalized understanding  HOME EXERCISE PROGRAM: Reviewed previously given post op HEP.  TREATMENT PERFORMED TODAY:  07/08/22: Pulleys x 2 min in  direction of flexion and abduction with pt reporting  a good stretch with this Ball up wall x 10 reps in direction of flexion and abduction Instructed pt in supine scapular strengthening series with yellow band x 10 reps each with pt returning therapist demo: diagonals, ER, horizontal abduction, overhead flexion with narrow and wide grip STM with cocoa butter in L s/l to R lats, rhomboids, serratus and levator to decrease tightness with numerous areas of muscle tightness at beginning that improved by end of session  07/01/22: MFR to R axilla in area of cording, PROM in to flexion, abduction and ER while performing MFR to axilla STM with cocoa butter in L s/l to R lats, rhomboids, serratus and levator to decrease tightness with numerous areas of muscle tightness at beginning that improved by end of session  06/27/22: Educated pt about signs of infection which pt presents with: redness, pain, and body aches. Educated pt about a seroma and to wear compression while she has a seroma and especially if it needs to be drained to help it heal. Reached out to her surgeon and she has an appointment tomorrow at [place]. Also reached out to her oncologist so that he was aware as well. Educated pt to hold on massage for now but try to do some stretching once her pain improves some.   06/25/22: MFR to cording in R axilla with gently ROM  ASSESSMENT:  CLINICAL IMPRESSION: Remeasured pt's ROM today and it has improved greatly. Instructed pt in supine scapular strengthening exercises today and issued these as part of an HEP. Continued with STM to lats and serratus and other scapular muscles to decrease tightness as pt has discomfort and increased tightness in these areas.   Pt will benefit from skilled therapeutic intervention to improve on the following deficits: Decreased knowledge of precautions, impaired UE functional use, pain, decreased ROM, postural dysfunction.   PT treatment/interventions: ADL/Self care home  management, Therapeutic exercises, Therapeutic activity, Patient/Family education, Self Care, Joint mobilization, Orthotic/Fit training, Manual lymph drainage, Compression bandaging, Vasopneumatic device, Manual therapy, and Re-evaluation   GOALS: Goals reviewed with patient? Yes  LONG TERM GOALS:  (STG=LTG)  GOALS Name Target Date  Goal status  1 Pt will demonstrate she has regained full shoulder ROM and function post operatively compared to baselines.  Baseline: 07/23/22 NEW  2 Pt will demonstrate 165 degrees of R shoulder flexion to allow her to reach overhead. 07/23/22 NEW  3 Pt will demonstrate 170 degrees of R shoulder abduction to allow her to reach out to the side. 07/23/22 NEW  4 Pt will report she is no longer having pain at end range from cording to allow improved comfort. 07/23/22 NEW  5 Pt will be independent in a home exercise program for continued strengthening and stretching.  07/23/22 NEW     PLAN:  PT FREQUENCY/DURATION: 2x/wk for 4 wks  PLAN FOR NEXT SESSION: pulleys, ball, MFR to cording R axilla, PROM R axilla   [facility]  [address], [address]  [place] [address]  [phone]  After Breast Cancer Class It is recommended you attend the ABC class to be educated on lymphedema risk reduction. This class is free of charge and lasts for 1 hour. It is a 1-time class. You will need to download the Webex app either on your phone or computer. We will send you a link the night before or the morning of the class. You should be able to click on that link to join the class. This is not a confidential class.  You don't have to turn your camera on, but other participants may be able to see your email address.  Scar massage You can begin gentle scar massage to you incision sites. Gently place one hand on the incision and move the skin (without sliding on the skin) in various directions. Do this for a few minutes and then you can gently massage either coconut  oil or vitamin E cream into the scars.  Compression garment You should continue wearing your compression bra until you feel like you no longer have swelling.  Home exercise Program Continue doing the exercises you were given until you feel like you can do them without feeling any tightness at the end.   Walking Program Studies show that 30 minutes of walking per day (fast enough to elevate your heart rate) can significantly reduce the risk of a cancer recurrence. If you can't walk due to other medical reasons, we encourage you to find another activity you could do (like a stationary bike or water exercise).  Posture After breast cancer surgery, people frequently sit with rounded shoulders posture because it puts their incisions on slack and feels better. If you sit like this and scar tissue forms in that position, you can become very tight and have pain sitting or standing with good posture. Try to be aware of your posture and sit and stand up tall to heal properly.  Follow up PT: It is recommended you return every 3 months for the first 3 years following surgery to be assessed on the SOZO machine for an L-Dex score. This helps prevent clinically significant lymphedema in 95% of patients. These follow up screens are 10 minute appointments that you are not billed for.  [facility], PT 07/08/2022, 3:03 PM

## 2022-07-09 ENCOUNTER — Ambulatory Visit
Admission: RE | Admit: 2022-07-09 | Discharge: 2022-07-09 | Disposition: A | Discharge status: Home / Self Care | Payer: BC Managed Care – PPO | Source: Ambulatory Visit | Attending: Radiation Oncology | Admitting: Radiation Oncology

## 2022-07-09 ENCOUNTER — Encounter: Payer: Self-pay | Admitting: *Deleted

## 2022-07-09 ENCOUNTER — Encounter (HOSPITAL_COMMUNITY): Payer: Self-pay

## 2022-07-09 DIAGNOSIS — Z170 Estrogen receptor positive status [ER+]: Secondary | ICD-10-CM | POA: Insufficient documentation

## 2022-07-09 DIAGNOSIS — Z510 Encounter for antineoplastic radiation therapy: Secondary | ICD-10-CM | POA: Diagnosis present

## 2022-07-09 DIAGNOSIS — C50411 Malignant neoplasm of upper-outer quadrant of right female breast: Secondary | ICD-10-CM | POA: Insufficient documentation

## 2022-07-10 ENCOUNTER — Ambulatory Visit: Payer: BC Managed Care – PPO | Admitting: Physical Therapy

## 2022-07-10 ENCOUNTER — Encounter: Payer: Self-pay | Admitting: Physical Therapy

## 2022-07-10 DIAGNOSIS — C50411 Malignant neoplasm of upper-outer quadrant of right female breast: Secondary | ICD-10-CM

## 2022-07-10 DIAGNOSIS — R293 Abnormal posture: Secondary | ICD-10-CM

## 2022-07-10 DIAGNOSIS — Z483 Aftercare following surgery for neoplasm: Secondary | ICD-10-CM

## 2022-07-10 DIAGNOSIS — M25611 Stiffness of right shoulder, not elsewhere classified: Secondary | ICD-10-CM | POA: Diagnosis not present

## 2022-07-10 NOTE — Therapy (Signed)
OUTPATIENT PHYSICAL THERAPY BREAST CANCER POST OP FOLLOW UP   Patient Name: Rhonda Charles MRN: [id number] DOB:1970/09/13, 52 y.o., female Today's Date: 07/10/2022  END OF SESSION:  PT End of Session - 07/10/22 1411     Visit Number 6    Number of Visits 10    Date for PT Re-Evaluation 07/23/22    PT Start Time 1401    PT Stop Time 1500    PT Time Calculation (min) 59 min    Activity Tolerance Patient tolerated treatment well    Behavior During Therapy WFL for tasks assessed/performed                  Past Medical History:  Diagnosis Date   Allergy    Seasonal   Anxiety    Hypertension    Past Surgical History:  Procedure Laterality Date   AXILLARY SENTINEL NODE BIOPSY Right 06/07/2022   Procedure: AXILLARY SENTINEL LYMPH NODE BIOPSY;  Surgeon: Rolm Bookbinder, MD;  Location: [facility];  Service: General;  Laterality: Right;   BREAST BIOPSY Right 05/02/2022   US biopsy/ ribbon clip/ path pending   BREAST BIOPSY Right 05/02/2022   [place] RT BREAST BX W LOC DEV 1ST LESION IMG BX [place] US GUIDE 05/02/2022 [facility]   BREAST BIOPSY  06/04/2022   MM RT RADIOACTIVE SEED LOC MAMMO GUIDE 06/04/2022 [facility]   BREAST LUMPECTOMY WITH RADIOACTIVE SEED AND SENTINEL LYMPH NODE BIOPSY Right 06/07/2022   Procedure: RIGHT BREAST LUMPECTOMY WITH RADIOACTIVE SEED;  Surgeon: Rolm Bookbinder, MD;  Location: [facility];  Service: General;  Laterality: Right;   COLONOSCOPY     WISDOM TOOTH EXTRACTION Bilateral    Patient Active Problem List   Diagnosis Date Noted   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[place]) 05/07/2022   Allergic rhinitis 01/21/2018   Anxiety 08/09/2014   Overweight (BMI 25.0-29.9) 08/09/2014    PCP: Wayland Denis PA-C  REFERRING PROVIDER: Rolm Bookbinder, MD  REFERRING DIAG: [phone] (ICD-10-CM) - Malignant neoplasm of upper-outer quadrant of right female breast ([place])   THERAPY DIAG:  Stiffness of right shoulder, not elsewhere  classified  Aftercare following surgery for neoplasm  Abnormal posture  Malignant neoplasm of upper-outer quadrant of right breast in female, estrogen receptor positive ([place])  Rationale for Evaluation and Treatment: Rehabilitation  ONSET DATE: 05/02/22  SUBJECTIVE:                                                                                                                                                                                           SUBJECTIVE STATEMENT: The pain is better. I only feel pain in that outer part  of my arm when stretching. My back area does not feel as tight.   PERTINENT HISTORY:  Patient was diagnosed on 05/02/22 with right grade 1. It measures 5 x 4 x 5 mm and is located in the upper-outer quadrant. It is ER/PR+, Her2-. Ki67 not completed. 06/07/22: R breast lumpectomy SLNB 0/3    PATIENT GOALS:  Reassess how my recovery is going related to arm function, pain, and swelling.  PAIN:  Are you having pain? No  PRECAUTIONS: Recent Surgery, right UE Lymphedema risk,   ACTIVITY LEVEL / LEISURE: doing post op stretches, squeezing hand strengthening ball   OBJECTIVE:   PATIENT SURVEYS:  QUICK DASH:     OBSERVATIONS: R breast still with some post op edema, encouraged pt to wear her compression bra, cording visible in R axilla  POSTURE:  Forward head and rounded shoulders posture   UPPER EXTREMITY AROM/PROM:   A/PROM RIGHT   eval   RIGHT 06/25/22 RIGHT 07/08/22  Shoulder extension 63 75   Shoulder flexion 168 140 164  Shoulder abduction 173 147 165  Shoulder internal rotation 38 60   Shoulder external rotation 83 82                           (Blank rows = not tested)   A/PROM LEFT   eval  Shoulder extension 54  Shoulder flexion 166  Shoulder abduction 175  Shoulder internal rotation 38  Shoulder external rotation 82                          (Blank rows = not tested)   CERVICAL AROM: All within normal limits:      Percent limited   Flexion WFL  Extension WFL  Right lateral flexion WFL  Left lateral flexion WFL  Right rotation WFL  Left rotation WFL      UPPER EXTREMITY STRENGTH: 5/5   LYMPHEDEMA ASSESSMENTS:    LANDMARK RIGHT   eval RIGHT 06/25/22  10 cm proximal to olecranon process 31 31  Olecranon process 24.5 24.5  10 cm proximal to ulnar styloid process 21 20.5  Just proximal to ulnar styloid process 14.5 14.8  Across hand at thumb web space 19 19  At base of 2nd digit 5.6 5.8  (Blank rows = not tested)   LANDMARK LEFT   eval  10 cm proximal to olecranon process 31.1  Olecranon process 24.4  10 cm proximal to ulnar styloid process 21.1  Just proximal to ulnar styloid process 14  Across hand at thumb web space 19.3  At base of 2nd digit 5.5  (Blank rows = not tested)  Surgery type/Date: 06/07/22- R lumpectomy and SLNB Number of lymph nodes removed: 0/3 Current/past treatment (chemo, radiation, hormone therapy): waiting on oncotype test to determine chemo, will need 6 weeks of radiation, will need hormone therapy Other symptoms:  Heaviness/tightness Yes Pain Yes Pitting edema No Infections No Decreased scar mobility Yes Stemmer sign No  PATIENT EDUCATION:  Education details: ABC class, axillary cording, scar mobilization, importance of stretching  Person educated: Patient Education method: Explanation and Handouts Education comprehension: verbalized understanding  HOME EXERCISE PROGRAM: Reviewed previously given post op HEP.  TREATMENT PERFORMED TODAY: 07/08/22: Pulleys x 2 min in direction of flexion and abduction with pt reporting a good stretch with this Ball up wall x 10 reps in direction of flexion and abduction STM with cocoa butter in L s/l to  R lats, rhomboids, serratus and levator to decrease tightness with numerous areas of muscle tightness at beginning that improved by end of session PROM to R shoulder in to flexion and abduction with MFR to cording in R  axilla  07/08/22: Pulleys x 2 min in direction of flexion and abduction with pt reporting a good stretch with this Ball up wall x 10 reps in direction of flexion and abduction Instructed pt in supine scapular strengthening series with yellow band x 10 reps each with pt returning therapist demo: diagonals, ER, horizontal abduction, overhead flexion with narrow and wide grip STM with cocoa butter in L s/l to R lats, rhomboids, serratus and levator to decrease tightness with numerous areas of muscle tightness at beginning that improved by end of session  07/01/22: MFR to R axilla in area of cording, PROM in to flexion, abduction and ER while performing MFR to axilla STM with cocoa butter in L s/l to R lats, rhomboids, serratus and levator to decrease tightness with numerous areas of muscle tightness at beginning that improved by end of session  06/27/22: Educated pt about signs of infection which pt presents with: redness, pain, and body aches. Educated pt about a seroma and to wear compression while she has a seroma and especially if it needs to be drained to help it heal. Reached out to her surgeon and she has an appointment tomorrow at [facility]. Also reached out to her oncologist so that he was aware as well. Educated pt to hold on massage for now but try to do some stretching once her pain improves some.   06/25/22: MFR to cording in R axilla with gently ROM  ASSESSMENT:  CLINICAL IMPRESSION: Pt has been doing the band exercises and stretches at home. She is still having tightness at end range from the cording in her axilla. Her muscles tightness is better today though it is still tight it is better than it was at last session. Continued with STM and PROM to R shoulder today.   Pt will benefit from skilled therapeutic intervention to improve on the following deficits: Decreased knowledge of precautions, impaired UE functional use, pain, decreased ROM, postural dysfunction.   PT treatment/interventions:  ADL/Self care home management, Therapeutic exercises, Therapeutic activity, Patient/Family education, Self Care, Joint mobilization, Orthotic/Fit training, Manual lymph drainage, Compression bandaging, Vasopneumatic device, Manual therapy, and Re-evaluation   GOALS: Goals reviewed with patient? Yes  LONG TERM GOALS:  (STG=LTG)  GOALS Name Target Date  Goal status  1 Pt will demonstrate she has regained full shoulder ROM and function post operatively compared to baselines.  Baseline: 07/23/22 NEW  2 Pt will demonstrate 165 degrees of R shoulder flexion to allow her to reach overhead. 07/23/22 NEW  3 Pt will demonstrate 170 degrees of R shoulder abduction to allow her to reach out to the side. 07/23/22 NEW  4 Pt will report she is no longer having pain at end range from cording to allow improved comfort. 07/23/22 NEW  5 Pt will be independent in a home exercise program for continued strengthening and stretching.  07/23/22 NEW     PLAN:  PT FREQUENCY/DURATION: 2x/wk for 4 wks  PLAN FOR NEXT SESSION: pulleys, ball, MFR to cording R axilla, PROM R axilla   [facility]  [address], [address]  [place] [address]  [phone]  After Breast Cancer Class It is recommended you attend the ABC class to be educated on lymphedema risk reduction. This class is free  of charge and lasts for 1 hour. It is a 1-time class. You will need to download the Webex app either on your phone or computer. We will send you a link the night before or the morning of the class. You should be able to click on that link to join the class. This is not a confidential class. You don't have to turn your camera on, but other participants may be able to see your email address.  Scar massage You can begin gentle scar massage to you incision sites. Gently place one hand on the incision and move the skin (without sliding on the skin) in various directions. Do this for a few minutes and then you can gently  massage either coconut oil or vitamin E cream into the scars.  Compression garment You should continue wearing your compression bra until you feel like you no longer have swelling.  Home exercise Program Continue doing the exercises you were given until you feel like you can do them without feeling any tightness at the end.   Walking Program Studies show that 30 minutes of walking per day (fast enough to elevate your heart rate) can significantly reduce the risk of a cancer recurrence. If you can't walk due to other medical reasons, we encourage you to find another activity you could do (like a stationary bike or water exercise).  Posture After breast cancer surgery, people frequently sit with rounded shoulders posture because it puts their incisions on slack and feels better. If you sit like this and scar tissue forms in that position, you can become very tight and have pain sitting or standing with good posture. Try to be aware of your posture and sit and stand up tall to heal properly.  Follow up PT: It is recommended you return every 3 months for the first 3 years following surgery to be assessed on the SOZO machine for an L-Dex score. This helps prevent clinically significant lymphedema in 95% of patients. These follow up screens are 10 minute appointments that you are not billed for.  [facility], PT 07/10/2022, 3:17 PM

## 2022-07-15 DIAGNOSIS — Z510 Encounter for antineoplastic radiation therapy: Secondary | ICD-10-CM | POA: Diagnosis not present

## 2022-07-16 ENCOUNTER — Encounter: Payer: Self-pay | Admitting: Physical Therapy

## 2022-07-16 ENCOUNTER — Other Ambulatory Visit: Payer: Self-pay | Admitting: *Deleted

## 2022-07-16 ENCOUNTER — Ambulatory Visit: Payer: BC Managed Care – PPO | Admitting: Physical Therapy

## 2022-07-16 DIAGNOSIS — M25611 Stiffness of right shoulder, not elsewhere classified: Secondary | ICD-10-CM

## 2022-07-16 DIAGNOSIS — Z483 Aftercare following surgery for neoplasm: Secondary | ICD-10-CM

## 2022-07-16 DIAGNOSIS — Z170 Estrogen receptor positive status [ER+]: Secondary | ICD-10-CM

## 2022-07-16 DIAGNOSIS — R293 Abnormal posture: Secondary | ICD-10-CM

## 2022-07-16 DIAGNOSIS — C50411 Malignant neoplasm of upper-outer quadrant of right female breast: Secondary | ICD-10-CM

## 2022-07-16 NOTE — Therapy (Signed)
OUTPATIENT PHYSICAL THERAPY BREAST CANCER POST OP FOLLOW UP   Patient Name: Rhonda Charles MRN: [id number] DOB:Sep 04, 1970, 52 y.o., female Today's Date: 07/16/2022  END OF SESSION:  PT End of Session - 07/16/22 1015     Visit Number 7    Number of Visits 10    Date for PT Re-Evaluation 07/23/22    PT Start Time 1005    PT Stop Time 1056    PT Time Calculation (min) 51 min    Activity Tolerance Patient tolerated treatment well    Behavior During Therapy WFL for tasks assessed/performed                  Past Medical History:  Diagnosis Date   Allergy    Seasonal   Anxiety    Hypertension    Past Surgical History:  Procedure Laterality Date   AXILLARY SENTINEL NODE BIOPSY Right 06/07/2022   Procedure: AXILLARY SENTINEL LYMPH NODE BIOPSY;  Surgeon: Rolm Bookbinder, MD;  Location: [facility];  Service: General;  Laterality: Right;   BREAST BIOPSY Right 05/02/2022   US biopsy/ ribbon clip/ path pending   BREAST BIOPSY Right 05/02/2022   [place] RT BREAST BX W LOC DEV 1ST LESION IMG BX [place] US GUIDE 05/02/2022 [facility]   BREAST BIOPSY  06/04/2022   MM RT RADIOACTIVE SEED LOC MAMMO GUIDE 06/04/2022 [facility]   BREAST LUMPECTOMY WITH RADIOACTIVE SEED AND SENTINEL LYMPH NODE BIOPSY Right 06/07/2022   Procedure: RIGHT BREAST LUMPECTOMY WITH RADIOACTIVE SEED;  Surgeon: Rolm Bookbinder, MD;  Location: [facility];  Service: General;  Laterality: Right;   COLONOSCOPY     WISDOM TOOTH EXTRACTION Bilateral    Patient Active Problem List   Diagnosis Date Noted   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[place]) 05/07/2022   Allergic rhinitis 01/21/2018   Anxiety 08/09/2014   Overweight (BMI 25.0-29.9) 08/09/2014    PCP: Wayland Denis PA-C  REFERRING PROVIDER: Rolm Bookbinder, MD  REFERRING DIAG: [phone] (ICD-10-CM) - Malignant neoplasm of upper-outer quadrant of right female breast ([place])   THERAPY DIAG:  Stiffness of right shoulder, not elsewhere  classified  Aftercare following surgery for neoplasm  Abnormal posture  Malignant neoplasm of upper-outer quadrant of right breast in female, estrogen receptor positive ([place])  Rationale for Evaluation and Treatment: Rehabilitation  ONSET DATE: 05/02/22  SUBJECTIVE:                                                                                                                                                                                           SUBJECTIVE STATEMENT: I do feel the stretch when I reach down to pick something  up off the floor. I feel the stretch up to my armpit.   PERTINENT HISTORY:  Patient was diagnosed on 05/02/22 with right grade 1. It measures 5 x 4 x 5 mm and is located in the upper-outer quadrant. It is ER/PR+, Her2-. Ki67 not completed. 06/07/22: R breast lumpectomy SLNB 0/3    PATIENT GOALS:  Reassess how my recovery is going related to arm function, pain, and swelling.  PAIN:  Are you having pain? No  PRECAUTIONS: Recent Surgery, right UE Lymphedema risk,   ACTIVITY LEVEL / LEISURE: doing post op stretches, squeezing hand strengthening ball   OBJECTIVE:   PATIENT SURVEYS:  QUICK DASH:     OBSERVATIONS: R breast still with some post op edema, encouraged pt to wear her compression bra, cording visible in R axilla  POSTURE:  Forward head and rounded shoulders posture   UPPER EXTREMITY AROM/PROM:   A/PROM RIGHT   eval   RIGHT 06/25/22 RIGHT 07/08/22  Shoulder extension 63 75   Shoulder flexion 168 140 164  Shoulder abduction 173 147 165  Shoulder internal rotation 38 60   Shoulder external rotation 83 82                           (Blank rows = not tested)   A/PROM LEFT   eval  Shoulder extension 54  Shoulder flexion 166  Shoulder abduction 175  Shoulder internal rotation 38  Shoulder external rotation 82                          (Blank rows = not tested)   CERVICAL AROM: All within normal limits:      Percent limited  Flexion WFL   Extension WFL  Right lateral flexion WFL  Left lateral flexion WFL  Right rotation WFL  Left rotation WFL      UPPER EXTREMITY STRENGTH: 5/5   LYMPHEDEMA ASSESSMENTS:    LANDMARK RIGHT   eval RIGHT 06/25/22  10 cm proximal to olecranon process 31 31  Olecranon process 24.5 24.5  10 cm proximal to ulnar styloid process 21 20.5  Just proximal to ulnar styloid process 14.5 14.8  Across hand at thumb web space 19 19  At base of 2nd digit 5.6 5.8  (Blank rows = not tested)   LANDMARK LEFT   eval  10 cm proximal to olecranon process 31.1  Olecranon process 24.4  10 cm proximal to ulnar styloid process 21.1  Just proximal to ulnar styloid process 14  Across hand at thumb web space 19.3  At base of 2nd digit 5.5  (Blank rows = not tested)  Surgery type/Date: 06/07/22- R lumpectomy and SLNB Number of lymph nodes removed: 0/3 Current/past treatment (chemo, radiation, hormone therapy): waiting on oncotype test to determine chemo, will need 6 weeks of radiation, will need hormone therapy Other symptoms:  Heaviness/tightness Yes Pain Yes Pitting edema No Infections No Decreased scar mobility Yes Stemmer sign No  PATIENT EDUCATION:  Education details: ABC class, axillary cording, scar mobilization, importance of stretching  Person educated: Patient Education method: Explanation and Handouts Education comprehension: verbalized understanding  HOME EXERCISE PROGRAM: Reviewed previously given post op HEP.  TREATMENT PERFORMED TODAY: 07/16/22: Pulleys x 2 min in direction of flexion and abduction with pt reporting a good stretch with this Ball up wall x 10 reps in direction of flexion and abduction PROM to R shoulder in to flexion and abduction  while performing MFR to cording throughout RUE - several cords are present in R axilla extending down to forearm  07/08/22: Pulleys x 2 min in direction of flexion and abduction with pt reporting a good stretch with this Ball up wall x 10  reps in direction of flexion and abduction STM with cocoa butter in L s/l to R lats, rhomboids, serratus and levator to decrease tightness with numerous areas of muscle tightness at beginning that improved by end of session PROM to R shoulder in to flexion and abduction with MFR to cording in R axilla  07/08/22: Pulleys x 2 min in direction of flexion and abduction with pt reporting a good stretch with this Ball up wall x 10 reps in direction of flexion and abduction Instructed pt in supine scapular strengthening series with yellow band x 10 reps each with pt returning therapist demo: diagonals, ER, horizontal abduction, overhead flexion with narrow and wide grip STM with cocoa butter in L s/l to R lats, rhomboids, serratus and levator to decrease tightness with numerous areas of muscle tightness at beginning that improved by end of session  07/01/22: MFR to R axilla in area of cording, PROM in to flexion, abduction and ER while performing MFR to axilla STM with cocoa butter in L s/l to R lats, rhomboids, serratus and levator to decrease tightness with numerous areas of muscle tightness at beginning that improved by end of session  06/27/22: Educated pt about signs of infection which pt presents with: redness, pain, and body aches. Educated pt about a seroma and to wear compression while she has a seroma and especially if it needs to be drained to help it heal. Reached out to her surgeon and she has an appointment tomorrow at [place]. Also reached out to her oncologist so that he was aware as well. Educated pt to hold on massage for now but try to do some stretching once her pain improves some.   06/25/22: MFR to cording in R axilla with gently ROM  ASSESSMENT:  CLINICAL IMPRESSION: Pt is scheduled to begin radiation tomorrow. She still has cording present in her R UE. There are several cords in her axilla extending down to her forearm. Educated pt to continue to stretch and lean in to the wall to  increase her stretch in to flexion and abduction as well as median nerve stretch to help stretch cording as well.   Pt will benefit from skilled therapeutic intervention to improve on the following deficits: Decreased knowledge of precautions, impaired UE functional use, pain, decreased ROM, postural dysfunction.   PT treatment/interventions: ADL/Self care home management, Therapeutic exercises, Therapeutic activity, Patient/Family education, Self Care, Joint mobilization, Orthotic/Fit training, Manual lymph drainage, Compression bandaging, Vasopneumatic device, Manual therapy, and Re-evaluation   GOALS: Goals reviewed with patient? Yes  LONG TERM GOALS:  (STG=LTG)  GOALS Name Target Date  Goal status  1 Pt will demonstrate she has regained full shoulder ROM and function post operatively compared to baselines.  Baseline: 07/23/22 NEW  2 Pt will demonstrate 165 degrees of R shoulder flexion to allow her to reach overhead. 07/23/22 NEW  3 Pt will demonstrate 170 degrees of R shoulder abduction to allow her to reach out to the side. 07/23/22 NEW  4 Pt will report she is no longer having pain at end range from cording to allow improved comfort. 07/23/22 NEW  5 Pt will be independent in a home exercise program for continued strengthening and stretching.  07/23/22 NEW  PLAN:  PT FREQUENCY/DURATION: 2x/wk for 4 wks  PLAN FOR NEXT SESSION: pulleys, ball, MFR to cording R axilla, PROM R axilla   [facility]  [address]  [place] [address]  [phone]  After Breast Cancer Class It is recommended you attend the ABC class to be educated on lymphedema risk reduction. This class is free of charge and lasts for 1 hour. It is a 1-time class. You will need to download the Webex app either on your phone or computer. We will send you a link the night before or the morning of the class. You should be able to click on that link to join the class. This is not a  confidential class. You don't have to turn your camera on, but other participants may be able to see your email address.  Scar massage You can begin gentle scar massage to you incision sites. Gently place one hand on the incision and move the skin (without sliding on the skin) in various directions. Do this for a few minutes and then you can gently massage either coconut oil or vitamin E cream into the scars.  Compression garment You should continue wearing your compression bra until you feel like you no longer have swelling.  Home exercise Program Continue doing the exercises you were given until you feel like you can do them without feeling any tightness at the end.   Walking Program Studies show that 30 minutes of walking per day (fast enough to elevate your heart rate) can significantly reduce the risk of a cancer recurrence. If you can't walk due to other medical reasons, we encourage you to find another activity you could do (like a stationary bike or water exercise).  Posture After breast cancer surgery, people frequently sit with rounded shoulders posture because it puts their incisions on slack and feels better. If you sit like this and scar tissue forms in that position, you can become very tight and have pain sitting or standing with good posture. Try to be aware of your posture and sit and stand up tall to heal properly.  Follow up PT: It is recommended you return every 3 months for the first 3 years following surgery to be assessed on the SOZO machine for an L-Dex score. This helps prevent clinically significant lymphedema in 95% of patients. These follow up screens are 10 minute appointments that you are not billed for.  [facility], PT 07/16/2022, 10:59 AM

## 2022-07-17 ENCOUNTER — Ambulatory Visit: Admission: RE | Admit: 2022-07-17 | Payer: BC Managed Care – PPO | Source: Ambulatory Visit

## 2022-07-18 ENCOUNTER — Other Ambulatory Visit: Payer: Self-pay

## 2022-07-18 ENCOUNTER — Ambulatory Visit
Admission: RE | Admit: 2022-07-18 | Discharge: 2022-07-18 | Disposition: A | Discharge status: Home / Self Care | Payer: BC Managed Care – PPO | Source: Ambulatory Visit | Attending: Radiation Oncology | Admitting: Radiation Oncology

## 2022-07-18 DIAGNOSIS — Z510 Encounter for antineoplastic radiation therapy: Secondary | ICD-10-CM | POA: Diagnosis not present

## 2022-07-18 LAB — RAD ONC ARIA SESSION SUMMARY
Course Elapsed Days: 0
Plan Fractions Treated to Date: 1
Plan Prescribed Dose Per Fraction: 3.5 Gy
Plan Total Fractions Prescribed: 10
Plan Total Prescribed Dose: 35 Gy
Reference Point Dosage Given to Date: 3.5 Gy
Reference Point Session Dosage Given: 3.5 Gy
Session Number: 1

## 2022-07-19 ENCOUNTER — Other Ambulatory Visit: Payer: Self-pay

## 2022-07-19 ENCOUNTER — Ambulatory Visit
Admission: RE | Admit: 2022-07-19 | Discharge: 2022-07-19 | Disposition: A | Discharge status: Home / Self Care | Payer: BC Managed Care – PPO | Source: Ambulatory Visit | Attending: Radiation Oncology | Admitting: Radiation Oncology

## 2022-07-19 DIAGNOSIS — Z510 Encounter for antineoplastic radiation therapy: Secondary | ICD-10-CM | POA: Diagnosis not present

## 2022-07-19 LAB — RAD ONC ARIA SESSION SUMMARY
Course Elapsed Days: 1
Plan Fractions Treated to Date: 2
Plan Prescribed Dose Per Fraction: 3.5 Gy
Plan Total Fractions Prescribed: 10
Plan Total Prescribed Dose: 35 Gy
Reference Point Dosage Given to Date: 7 Gy
Reference Point Session Dosage Given: 3.5 Gy
Session Number: 2

## 2022-07-22 ENCOUNTER — Other Ambulatory Visit: Payer: Self-pay

## 2022-07-22 ENCOUNTER — Ambulatory Visit
Admission: RE | Admit: 2022-07-22 | Discharge: 2022-07-22 | Disposition: A | Discharge status: Home / Self Care | Payer: BC Managed Care – PPO | Source: Ambulatory Visit | Attending: Radiation Oncology | Admitting: Radiation Oncology

## 2022-07-22 DIAGNOSIS — C50411 Malignant neoplasm of upper-outer quadrant of right female breast: Secondary | ICD-10-CM | POA: Insufficient documentation

## 2022-07-22 DIAGNOSIS — Z170 Estrogen receptor positive status [ER+]: Secondary | ICD-10-CM | POA: Insufficient documentation

## 2022-07-22 DIAGNOSIS — Z510 Encounter for antineoplastic radiation therapy: Secondary | ICD-10-CM | POA: Diagnosis present

## 2022-07-22 LAB — RAD ONC ARIA SESSION SUMMARY
Course Elapsed Days: 4
Plan Fractions Treated to Date: 3
Plan Prescribed Dose Per Fraction: 3.5 Gy
Plan Total Fractions Prescribed: 10
Plan Total Prescribed Dose: 35 Gy
Reference Point Dosage Given to Date: 10.5 Gy
Reference Point Session Dosage Given: 3.5 Gy
Session Number: 3

## 2022-07-23 ENCOUNTER — Ambulatory Visit
Admission: RE | Admit: 2022-07-23 | Discharge: 2022-07-23 | Disposition: A | Discharge status: Home / Self Care | Payer: BC Managed Care – PPO | Source: Ambulatory Visit | Attending: Radiation Oncology | Admitting: Radiation Oncology

## 2022-07-23 ENCOUNTER — Other Ambulatory Visit: Payer: Self-pay

## 2022-07-23 DIAGNOSIS — Z510 Encounter for antineoplastic radiation therapy: Secondary | ICD-10-CM | POA: Diagnosis not present

## 2022-07-23 LAB — RAD ONC ARIA SESSION SUMMARY
Course Elapsed Days: 5
Plan Fractions Treated to Date: 4
Plan Prescribed Dose Per Fraction: 3.5 Gy
Plan Total Fractions Prescribed: 10
Plan Total Prescribed Dose: 35 Gy
Reference Point Dosage Given to Date: 14 Gy
Reference Point Session Dosage Given: 3.5 Gy
Session Number: 4

## 2022-07-24 ENCOUNTER — Other Ambulatory Visit: Payer: Self-pay

## 2022-07-24 ENCOUNTER — Ambulatory Visit
Admission: RE | Admit: 2022-07-24 | Discharge: 2022-07-24 | Disposition: A | Discharge status: Home / Self Care | Payer: BC Managed Care – PPO | Source: Ambulatory Visit | Attending: Radiation Oncology | Admitting: Radiation Oncology

## 2022-07-24 DIAGNOSIS — Z510 Encounter for antineoplastic radiation therapy: Secondary | ICD-10-CM | POA: Diagnosis not present

## 2022-07-24 LAB — RAD ONC ARIA SESSION SUMMARY
Course Elapsed Days: 6
Plan Fractions Treated to Date: 5
Plan Prescribed Dose Per Fraction: 3.5 Gy
Plan Total Fractions Prescribed: 10
Plan Total Prescribed Dose: 35 Gy
Reference Point Dosage Given to Date: 17.5 Gy
Reference Point Session Dosage Given: 3.5 Gy
Session Number: 5

## 2022-07-25 ENCOUNTER — Other Ambulatory Visit: Payer: Self-pay

## 2022-07-25 ENCOUNTER — Inpatient Hospital Stay: Payer: BC Managed Care – PPO

## 2022-07-25 ENCOUNTER — Ambulatory Visit
Admission: RE | Admit: 2022-07-25 | Discharge: 2022-07-25 | Disposition: A | Discharge status: Home / Self Care | Payer: BC Managed Care – PPO | Source: Ambulatory Visit | Attending: Radiation Oncology | Admitting: Radiation Oncology

## 2022-07-25 DIAGNOSIS — I10 Essential (primary) hypertension: Secondary | ICD-10-CM | POA: Insufficient documentation

## 2022-07-25 DIAGNOSIS — Z79811 Long term (current) use of aromatase inhibitors: Secondary | ICD-10-CM | POA: Insufficient documentation

## 2022-07-25 DIAGNOSIS — Z803 Family history of malignant neoplasm of breast: Secondary | ICD-10-CM | POA: Insufficient documentation

## 2022-07-25 DIAGNOSIS — Z510 Encounter for antineoplastic radiation therapy: Secondary | ICD-10-CM | POA: Diagnosis not present

## 2022-07-25 DIAGNOSIS — C50411 Malignant neoplasm of upper-outer quadrant of right female breast: Secondary | ICD-10-CM | POA: Insufficient documentation

## 2022-07-25 DIAGNOSIS — F17210 Nicotine dependence, cigarettes, uncomplicated: Secondary | ICD-10-CM | POA: Insufficient documentation

## 2022-07-25 DIAGNOSIS — Z79899 Other long term (current) drug therapy: Secondary | ICD-10-CM | POA: Insufficient documentation

## 2022-07-25 DIAGNOSIS — Z170 Estrogen receptor positive status [ER+]: Secondary | ICD-10-CM | POA: Insufficient documentation

## 2022-07-25 LAB — CBC (CANCER CENTER ONLY)
HCT: 41.3 % (ref 36.0–46.0)
Hemoglobin: 13.6 g/dL (ref 12.0–15.0)
MCH: 30.6 pg (ref 26.0–34.0)
MCHC: 32.9 g/dL (ref 30.0–36.0)
MCV: 92.8 fL (ref 80.0–100.0)
Platelet Count: 273 10*3/uL (ref 150–400)
RBC: 4.45 MIL/uL (ref 3.87–5.11)
RDW: 12.2 % (ref 11.5–15.5)
WBC Count: 5.2 10*3/uL (ref 4.0–10.5)
nRBC: 0 % (ref 0.0–0.2)

## 2022-07-25 LAB — RAD ONC ARIA SESSION SUMMARY
Course Elapsed Days: 7
Plan Fractions Treated to Date: 6
Plan Prescribed Dose Per Fraction: 3.5 Gy
Plan Total Fractions Prescribed: 10
Plan Total Prescribed Dose: 35 Gy
Reference Point Dosage Given to Date: 21 Gy
Reference Point Session Dosage Given: 3.5 Gy
Session Number: 6

## 2022-07-26 ENCOUNTER — Ambulatory Visit: Payer: BC Managed Care – PPO

## 2022-07-27 ENCOUNTER — Ambulatory Visit: Payer: BC Managed Care – PPO

## 2022-07-29 ENCOUNTER — Ambulatory Visit
Admission: RE | Admit: 2022-07-29 | Discharge: 2022-07-29 | Disposition: A | Discharge status: Home / Self Care | Payer: BC Managed Care – PPO | Source: Ambulatory Visit | Attending: Radiation Oncology | Admitting: Radiation Oncology

## 2022-07-29 ENCOUNTER — Other Ambulatory Visit: Payer: Self-pay

## 2022-07-29 DIAGNOSIS — Z510 Encounter for antineoplastic radiation therapy: Secondary | ICD-10-CM | POA: Diagnosis not present

## 2022-07-29 LAB — RAD ONC ARIA SESSION SUMMARY
Course Elapsed Days: 11
Plan Fractions Treated to Date: 7
Plan Prescribed Dose Per Fraction: 3.5 Gy
Plan Total Fractions Prescribed: 10
Plan Total Prescribed Dose: 35 Gy
Reference Point Dosage Given to Date: 24.5 Gy
Reference Point Session Dosage Given: 3.5 Gy
Session Number: 7

## 2022-07-30 ENCOUNTER — Ambulatory Visit
Admission: RE | Admit: 2022-07-30 | Discharge: 2022-07-30 | Disposition: A | Discharge status: Home / Self Care | Payer: BC Managed Care – PPO | Source: Ambulatory Visit | Attending: Radiation Oncology | Admitting: Radiation Oncology

## 2022-07-30 ENCOUNTER — Other Ambulatory Visit: Payer: Self-pay

## 2022-07-30 DIAGNOSIS — Z510 Encounter for antineoplastic radiation therapy: Secondary | ICD-10-CM | POA: Diagnosis not present

## 2022-07-30 LAB — RAD ONC ARIA SESSION SUMMARY
Course Elapsed Days: 12
Plan Fractions Treated to Date: 8
Plan Prescribed Dose Per Fraction: 3.5 Gy
Plan Total Fractions Prescribed: 10
Plan Total Prescribed Dose: 35 Gy
Reference Point Dosage Given to Date: 28 Gy
Reference Point Session Dosage Given: 3.5 Gy
Session Number: 8

## 2022-07-31 ENCOUNTER — Other Ambulatory Visit: Payer: Self-pay

## 2022-07-31 ENCOUNTER — Ambulatory Visit
Admission: RE | Admit: 2022-07-31 | Discharge: 2022-07-31 | Disposition: A | Discharge status: Home / Self Care | Payer: BC Managed Care – PPO | Source: Ambulatory Visit | Attending: Radiation Oncology | Admitting: Radiation Oncology

## 2022-07-31 ENCOUNTER — Ambulatory Visit: Payer: BC Managed Care – PPO

## 2022-07-31 DIAGNOSIS — Z510 Encounter for antineoplastic radiation therapy: Secondary | ICD-10-CM | POA: Diagnosis not present

## 2022-07-31 LAB — RAD ONC ARIA SESSION SUMMARY
Course Elapsed Days: 13
Plan Fractions Treated to Date: 9
Plan Prescribed Dose Per Fraction: 3.5 Gy
Plan Total Fractions Prescribed: 10
Plan Total Prescribed Dose: 35 Gy
Reference Point Dosage Given to Date: 31.5 Gy
Reference Point Session Dosage Given: 3.5 Gy
Session Number: 9

## 2022-08-01 ENCOUNTER — Inpatient Hospital Stay (HOSPITAL_BASED_OUTPATIENT_CLINIC_OR_DEPARTMENT_OTHER): Payer: BC Managed Care – PPO | Admitting: Oncology

## 2022-08-01 ENCOUNTER — Ambulatory Visit
Admission: RE | Admit: 2022-08-01 | Discharge: 2022-08-01 | Disposition: A | Discharge status: Home / Self Care | Payer: BC Managed Care – PPO | Source: Ambulatory Visit | Attending: Radiation Oncology | Admitting: Radiation Oncology

## 2022-08-01 ENCOUNTER — Other Ambulatory Visit: Payer: Self-pay

## 2022-08-01 ENCOUNTER — Encounter: Payer: Self-pay | Admitting: Oncology

## 2022-08-01 VITALS — BP 144/79 | HR 73 | Temp 98.5°F | Resp 16 | Ht 63.0 in | Wt 165.3 lb

## 2022-08-01 DIAGNOSIS — Z510 Encounter for antineoplastic radiation therapy: Secondary | ICD-10-CM | POA: Diagnosis not present

## 2022-08-01 DIAGNOSIS — C50911 Malignant neoplasm of unspecified site of right female breast: Secondary | ICD-10-CM | POA: Diagnosis not present

## 2022-08-01 LAB — RAD ONC ARIA SESSION SUMMARY
Course Elapsed Days: 14
Plan Fractions Treated to Date: 10
Plan Prescribed Dose Per Fraction: 3.5 Gy
Plan Total Fractions Prescribed: 10
Plan Total Prescribed Dose: 35 Gy
Reference Point Dosage Given to Date: 35 Gy
Reference Point Session Dosage Given: 3.5 Gy
Session Number: 10

## 2022-08-01 MED ORDER — LETROZOLE 2.5 MG PO TABS: 2.5000 mg | ORAL_TABLET | Freq: Every day | ORAL | 3 refills | Status: DC

## 2022-08-01 NOTE — Progress Notes (Signed)
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  Oncotype score Dx 22, low risk.  INTERVAL HISTORY: Patient returns to clinic today at the conclusion of her XRT for further evaluation and initiation of letrozole.  She noticed increased fatigue, but otherwise tolerated her treatments well. She has no neurologic complaints.  She denies any recent fevers or illnesses.  She has a good appetite and denies weight loss.  She has no chest pain, shortness of breath, cough, or hemoptysis.  She denies any nausea, vomiting, constipation, or diarrhea.  She has no urinary complaints.  Patient offers no further specific complaints today.  REVIEW OF SYSTEMS:   Review of Systems  Constitutional: Negative.  Negative for fever, malaise/fatigue and weight loss.  Respiratory: Negative.  Negative for cough, hemoptysis and shortness of breath.   Cardiovascular: Negative.  Negative for chest pain and leg swelling.  Gastrointestinal: Negative.  Negative for abdominal pain.  Genitourinary: Negative.  Negative for dysuria.  Musculoskeletal: Negative.  Negative for back pain.  Skin: Negative.  Negative for rash.  Neurological: Negative.  Negative for dizziness, focal weakness, weakness and headaches.  Psychiatric/Behavioral: Negative.  The patient is not nervous/anxious.     As per HPI. Otherwise, a complete review of systems is negative.  PAST MEDICAL HISTORY: Past Medical History:  Diagnosis Date   Allergy    Seasonal   Anxiety    Hypertension     PAST SURGICAL HISTORY: Past Surgical History:  Procedure Laterality Date   AXILLARY SENTINEL NODE BIOPSY Right 06/07/2022    Procedure: AXILLARY SENTINEL LYMPH NODE BIOPSY;  Surgeon: Emelia Loron, MD;  Location: [facility] OR;  Service: General;  Laterality: Right;   BREAST BIOPSY Right 05/02/2022   US biopsy/ ribbon clip/ path pending   BREAST BIOPSY Right 05/02/2022   [place] RT BREAST BX W LOC DEV 1ST LESION IMG BX SPEC US GUIDE 05/02/2022 [facility]   BREAST BIOPSY  06/04/2022   MM RT RADIOACTIVE SEED LOC MAMMO GUIDE 06/04/2022 [facility]   BREAST LUMPECTOMY WITH RADIOACTIVE SEED AND SENTINEL LYMPH NODE BIOPSY Right 06/07/2022   Procedure: RIGHT BREAST LUMPECTOMY WITH RADIOACTIVE SEED;  Surgeon: Emelia Loron, MD;  Location: [facility] OR;  Service: General;  Laterality: Right;   COLONOSCOPY     WISDOM TOOTH EXTRACTION Bilateral     FAMILY HISTORY: Family History  Problem Relation Age of Onset   Breast cancer Mother 69   Anxiety disorder Mother    Arthritis Mother    Cancer Mother    Diabetes Mother    Breast cancer Maternal Grandmother 5   Cancer Maternal Grandmother    Varicose Veins Maternal Grandmother     ADVANCED DIRECTIVES (Y/N):  N  HEALTH MAINTENANCE: Social History   Tobacco Use   Smoking status: Every Day    Packs/day: 0.25    Years: 15.00    Additional pack years: 0.00    Total pack years: 3.75    Types: Cigarettes   Tobacco comments:    In a program  Substance Use Topics   Alcohol use: Yes    Alcohol/week: 1.0 standard drink of alcohol    Types: 1 Cans of beer per week   Drug use: Never  Colonoscopy:  PAP:  Bone density:  Lipid panel:  No Known Allergies  Current Outpatient Medications  Medication Sig Dispense Refill   acetaminophen (TYLENOL) 325 MG tablet Take 650 mg by mouth every 6 (six) hours as needed for moderate pain.     b complex vitamins capsule Take 1 capsule by mouth daily.     cetirizine (ZYRTEC) 10 MG tablet Take 10 mg by mouth daily.     Cholecalciferol (VITAMIN D-1000 MAX ST) 25 MCG (1000 UT) tablet Take 1,000 Units by mouth daily.      clonazePAM (KLONOPIN) 0.5 MG tablet Take 0.5 mg by mouth daily as needed for anxiety.     escitalopram (LEXAPRO) 20 MG tablet Take 20 mg by mouth daily.     fluticasone (FLONASE) 50 MCG/ACT nasal spray Place 2 sprays into both nostrils daily as needed for allergies.     letrozole (FEMARA) 2.5 MG tablet Take 1 tablet (2.5 mg total) by mouth daily. 30 tablet 3   montelukast (SINGULAIR) 10 MG tablet Take 10 mg by mouth at bedtime.     propranolol (INDERAL) 20 MG tablet Take 20 mg by mouth daily.     nicotine (NICODERM CQ - DOSED IN MG/24 HOURS) 14 mg/24hr patch Place 14 mg onto the skin daily. (Patient not taking: Reported on 08/01/2022)     No current facility-administered medications for this visit.    OBJECTIVE: Vitals:   08/01/22 1444  BP: (!) 144/79  Pulse: 73  Resp: 16  Temp: 98.5 F (36.9 C)  SpO2: 100%     Body mass index is 29.28 kg/m.    ECOG FS:0 - Asymptomatic  General: Well-developed, well-nourished, no acute distress. Eyes: Pink conjunctiva, anicteric sclera. HEENT: Normocephalic, moist mucous membranes. Breast: Exam deferred today. Lungs: No audible wheezing or coughing. Heart: Regular rate and rhythm. Abdomen: Soft, nontender, no obvious distention. Musculoskeletal: No edema, cyanosis, or clubbing. Neuro: Alert, answering all questions appropriately. Cranial nerves grossly intact. Skin: No rashes or petechiae noted. Psych: Normal affect.  LAB RESULTS:  Lab Results  Component Value Date   NA 137 05/31/2022   K 4.0 05/31/2022   CL 101 05/31/2022   CO2 26 05/31/2022   GLUCOSE 102 (H) 05/31/2022   BUN 7 05/31/2022   CREATININE 0.80 05/31/2022   CALCIUM 9.2 05/31/2022   GFRNONAA >60 05/31/2022    Lab Results  Component Value Date   WBC 5.2 07/25/2022   HGB 13.6 07/25/2022   HCT 41.3 07/25/2022   MCV 92.8 07/25/2022   PLT 273 07/25/2022     STUDIES: No results found.  ASSESSMENT: Pathologic stage Ia ER/PR positive, HER2 negative invasive carcinoma  of the right breast.  Oncotype score Dx 22, low risk.  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  Given her low risk Oncotype Dx score of 22, adjuvant chemotherapy was not necessary.  Patient completed adjuvant XRT on August 01, 2022.  She was given a prescription for letrozole today.  Continue treatment for a total of 5 years completing in April 2029.  Patient will have video-assisted telemedicine visit in 3 months for routine evaluation.   Bone health: Will get baseline bone mineral density in the next several weeks.  I spent a total of 20 minutes reviewing chart data, face-to-face evaluation with the patient, counseling and coordination of care as detailed above.   Patient expressed understanding and was in agreement with this  plan. She also understands that She can call clinic at any time with any questions, concerns, or complaints.    Cancer Staging  Invasive ductal carcinoma of right breast Staging form: Breast, AJCC 8th Edition - Pathologic stage from 07/03/2022: Stage IA (pT1c, pN0, cM0, G1, ER+, PR+, HER2-, Oncotype DX score: 22) - Signed by Jeralyn RuthsFinnegan, Josiane Labine J, MD on 07/03/2022 Stage prefix: Initial diagnosis Multigene prognostic tests performed: Oncotype DX Recurrence score range: Greater than or equal to 11 Histologic grading system: 3 grade system   Jeralyn Ruthsimothy J Alexander Aument, MD   08/01/2022 4:11 PM

## 2022-08-02 ENCOUNTER — Encounter: Payer: Self-pay | Admitting: *Deleted

## 2022-08-06 ENCOUNTER — Encounter: Payer: Self-pay | Admitting: Physical Therapy

## 2022-08-06 ENCOUNTER — Ambulatory Visit: Payer: BC Managed Care – PPO | Attending: General Surgery | Admitting: Physical Therapy

## 2022-08-06 DIAGNOSIS — Z170 Estrogen receptor positive status [ER+]: Secondary | ICD-10-CM | POA: Insufficient documentation

## 2022-08-06 DIAGNOSIS — M25611 Stiffness of right shoulder, not elsewhere classified: Secondary | ICD-10-CM | POA: Insufficient documentation

## 2022-08-06 DIAGNOSIS — R293 Abnormal posture: Secondary | ICD-10-CM | POA: Insufficient documentation

## 2022-08-06 DIAGNOSIS — Z483 Aftercare following surgery for neoplasm: Secondary | ICD-10-CM | POA: Insufficient documentation

## 2022-08-06 DIAGNOSIS — C50411 Malignant neoplasm of upper-outer quadrant of right female breast: Secondary | ICD-10-CM | POA: Diagnosis present

## 2022-08-06 NOTE — Therapy (Signed)
OUTPATIENT PHYSICAL THERAPY BREAST CANCER POST OP FOLLOW UP   Patient Name: Rhonda Charles MRN: [id number] DOB:1971-01-19, 52 y.o., female Today's Date: 08/06/2022  END OF SESSION:  PT End of Session - 08/06/22 1101     Visit Number 8    Number of Visits 10    Date for PT Re-Evaluation 07/23/22    PT Start Time 1100    PT Stop Time 1142    PT Time Calculation (min) 42 min    Activity Tolerance Patient tolerated treatment well    Behavior During Therapy WFL for tasks assessed/performed                  Past Medical History:  Diagnosis Date   Allergy    Seasonal   Anxiety    Hypertension    Past Surgical History:  Procedure Laterality Date   AXILLARY SENTINEL NODE BIOPSY Right 06/07/2022   Procedure: AXILLARY SENTINEL LYMPH NODE BIOPSY;  Surgeon: Emelia Loron, MD;  Location: [facility];  Service: General;  Laterality: Right;   BREAST BIOPSY Right 05/02/2022   US biopsy/ ribbon clip/ path pending   BREAST BIOPSY Right 05/02/2022   [place] RT BREAST BX W LOC DEV 1ST LESION IMG BX SPEC US GUIDE 05/02/2022 [facility]   BREAST BIOPSY  06/04/2022   MM RT RADIOACTIVE SEED LOC MAMMO GUIDE 06/04/2022 [facility]   BREAST LUMPECTOMY WITH RADIOACTIVE SEED AND SENTINEL LYMPH NODE BIOPSY Right 06/07/2022   Procedure: RIGHT BREAST LUMPECTOMY WITH RADIOACTIVE SEED;  Surgeon: Emelia Loron, MD;  Location: [facility] OR;  Service: General;  Laterality: Right;   COLONOSCOPY     WISDOM TOOTH EXTRACTION Bilateral    Patient Active Problem List   Diagnosis Date Noted   Invasive ductal carcinoma of right breast 05/07/2022   Allergic rhinitis 01/21/2018   Anxiety 08/09/2014   Overweight (BMI 25.0-29.9) 08/09/2014    PCP: Carren Rang PA-C  REFERRING PROVIDER: Emelia Loron, MD  REFERRING DIAG: [phone] (ICD-10-CM) - Malignant neoplasm of upper-outer quadrant of right female breast (HCC)   THERAPY DIAG:  Stiffness of right shoulder, not elsewhere  classified  Aftercare following surgery for neoplasm  Abnormal posture  Malignant neoplasm of upper-outer quadrant of right breast in female, estrogen receptor positive  Rationale for Evaluation and Treatment: Rehabilitation  ONSET DATE: 05/02/22  SUBJECTIVE:                                                                                                                                                                                           SUBJECTIVE STATEMENT: The cording seems to be doing better but the breast is very sore. I  stopped radiation last week.   PERTINENT HISTORY:  Patient was diagnosed on 05/02/22 with right grade 1. It measures 5 x 4 x 5 mm and is located in the upper-outer quadrant. It is ER/PR+, Her2-. Ki67 not completed. 06/07/22: R breast lumpectomy SLNB 0/3    PATIENT GOALS:  Reassess how my recovery is going related to arm function, pain, and swelling.  PAIN:  Are you having pain? No  PRECAUTIONS: Recent Surgery, right UE Lymphedema risk,   ACTIVITY LEVEL / LEISURE: doing post op stretches, squeezing hand strengthening ball   OBJECTIVE:   PATIENT SURVEYS:  QUICK DASH:     OBSERVATIONS: R breast still with some post op edema, encouraged pt to wear her compression bra, cording visible in R axilla  POSTURE:  Forward head and rounded shoulders posture   UPPER EXTREMITY AROM/PROM:   A/PROM RIGHT   eval   RIGHT 06/25/22 RIGHT 07/08/22 RIGHT 08/06/22  Shoulder extension 63 75    Shoulder flexion 168 140 164 165  Shoulder abduction 173 147 165 170  Shoulder internal rotation 38 60    Shoulder external rotation 83 82                            (Blank rows = not tested)   A/PROM LEFT   eval  Shoulder extension 54  Shoulder flexion 166  Shoulder abduction 175  Shoulder internal rotation 38  Shoulder external rotation 82                          (Blank rows = not tested)   CERVICAL AROM: All within normal limits:      Percent limited  Flexion  WFL  Extension WFL  Right lateral flexion WFL  Left lateral flexion WFL  Right rotation WFL  Left rotation WFL      UPPER EXTREMITY STRENGTH: 5/5   LYMPHEDEMA ASSESSMENTS:    LANDMARK RIGHT   eval RIGHT 06/25/22  10 cm proximal to olecranon process 31 31  Olecranon process 24.5 24.5  10 cm proximal to ulnar styloid process 21 20.5  Just proximal to ulnar styloid process 14.5 14.8  Across hand at thumb web space 19 19  At base of 2nd digit 5.6 5.8  (Blank rows = not tested)   LANDMARK LEFT   eval  10 cm proximal to olecranon process 31.1  Olecranon process 24.4  10 cm proximal to ulnar styloid process 21.1  Just proximal to ulnar styloid process 14  Across hand at thumb web space 19.3  At base of 2nd digit 5.5  (Blank rows = not tested)  Surgery type/Date: 06/07/22- R lumpectomy and SLNB Number of lymph nodes removed: 0/3 Current/past treatment (chemo, radiation, hormone therapy): waiting on oncotype test to determine chemo, will need 6 weeks of radiation, will need hormone therapy Other symptoms:  Heaviness/tightness Yes Pain Yes Pitting edema No Infections No Decreased scar mobility Yes Stemmer sign No  PATIENT EDUCATION:  Education details: ABC class, axillary cording, scar mobilization, importance of stretching  Person educated: Patient Education method: Explanation and Handouts Education comprehension: verbalized understanding  HOME EXERCISE PROGRAM: Reviewed previously given post op HEP.  TREATMENT PERFORMED TODAY: 08/06/22: Assessed breast for swelling and there is swelling but it is minimal and most likely caused from recent completion of radiation- educated pt that if it does not go away to let us know. Also educated pt she can wear  her compression bra if it doesn't cause increased discomfort.  STM with cocoa butter in L s/l to R lats, rhomboids, serratus and levator to decrease tightness with numerous areas of muscle tightness is lats in  beginning that  improved by end of session  07/16/22: Pulleys x 2 min in direction of flexion and abduction with pt reporting a good stretch with this Ball up wall x 10 reps in direction of flexion and abduction PROM to R shoulder in to flexion and abduction while performing MFR to cording throughout RUE - several cords are present in R axilla extending down to forearm  07/08/22: Pulleys x 2 min in direction of flexion and abduction with pt reporting a good stretch with this Ball up wall x 10 reps in direction of flexion and abduction STM with cocoa butter in L s/l to R lats, rhomboids, serratus and levator to decrease tightness with numerous areas of muscle tightness at beginning that improved by end of session PROM to R shoulder in to flexion and abduction with MFR to cording in R axilla  07/08/22: Pulleys x 2 min in direction of flexion and abduction with pt reporting a good stretch with this Ball up wall x 10 reps in direction of flexion and abduction Instructed pt in supine scapular strengthening series with yellow band x 10 reps each with pt returning therapist demo: diagonals, ER, horizontal abduction, overhead flexion with narrow and wide grip STM with cocoa butter in L s/l to R lats, rhomboids, serratus and levator to decrease tightness with numerous areas of muscle tightness at beginning that improved by end of session  07/01/22: MFR to R axilla in area of cording, PROM in to flexion, abduction and ER while performing MFR to axilla STM with cocoa butter in L s/l to R lats, rhomboids, serratus and levator to decrease tightness with numerous areas of muscle tightness at beginning that improved by end of session  06/27/22: Educated pt about signs of infection which pt presents with: redness, pain, and body aches. Educated pt about a seroma and to wear compression while she has a seroma and especially if it needs to be drained to help it heal. Reached out to her surgeon and she has an appointment tomorrow at  10am. Also reached out to her oncologist so that he was aware as well. Educated pt to hold on massage for now but try to do some stretching once her pain improves some.   06/25/22: MFR to cording in R axilla with gently ROM  ASSESSMENT:  CLINICAL IMPRESSION: Assessed pt's progress towards goals in therapy. Pt has now met all goals for therapy. She completed radiation last week. She has some breast swelling most likely a side effect of radiation that should resolve soon. Educated pt that if it does not resolve or worsens she may benefit from additional PT. Her muscle tightness has improved since eval and she is no longer having any cording or tightness at end range. She will be discharged from skilled PT services at this time.   Pt will benefit from skilled therapeutic intervention to improve on the following deficits: Decreased knowledge of precautions, impaired UE functional use, pain, decreased ROM, postural dysfunction.   PT treatment/interventions: ADL/Self care home management, Therapeutic exercises, Therapeutic activity, Patient/Family education, Self Care, Joint mobilization, Orthotic/Fit training, Manual lymph drainage, Compression bandaging, Vasopneumatic device, Manual therapy, and Re-evaluation   GOALS: Goals reviewed with patient? Yes  LONG TERM GOALS:  (STG=LTG)  GOALS Name Target Date  Goal status  1 Pt will demonstrate she has regained full shoulder ROM and function post operatively compared to baselines.  Baseline: 07/23/22 MET 08/06/22  2 Pt will demonstrate 165 degrees of R shoulder flexion to allow her to reach overhead. 07/23/22 MET 08/06/22- 165 today  3 Pt will demonstrate 170 degrees of R shoulder abduction to allow her to reach out to the side. 07/23/22 MET 08/06/22- 170 today  4 Pt will report she is no longer having pain at end range from cording to allow improved comfort. 07/23/22 MET 08/06/22- no pain at end range  5 Pt will be independent in a home exercise program for  continued strengthening and stretching.  07/23/22 MET 08/06/22 pt independent with all exercises     PLAN:  PT FREQUENCY/DURATION: 2x/wk for 4 wks  PLAN FOR NEXT SESSION: d/c this visit   PHYSICAL THERAPY DISCHARGE SUMMARY  Visits from Start of Care: 8  Current functional level related to goals / functional outcomes: All goals met   Remaining deficits: None   Education / Equipment: HEP, lymphedema education   Patient agrees to discharge. Patient goals were met. Patient is being discharged due to meeting the stated rehab goals.  [facility] [place], [place] 08/06/2022, 11:45 AM

## 2022-08-08 ENCOUNTER — Encounter: Payer: BC Managed Care – PPO | Admitting: Physical Therapy

## 2022-09-02 ENCOUNTER — Ambulatory Visit: Payer: BC Managed Care – PPO | Attending: General Surgery

## 2022-09-02 VITALS — Wt 164.2 lb

## 2022-09-02 DIAGNOSIS — Z483 Aftercare following surgery for neoplasm: Secondary | ICD-10-CM

## 2022-09-02 NOTE — Therapy (Signed)
  OUTPATIENT PHYSICAL THERAPY SOZO SCREENING NOTE   Patient Name: Rhonda Charles MRN: [id number] DOB:Mar 06, 1971, 52 y.o., female Today's Date: 09/02/2022  PCP: Carren Rang, PA-C REFERRING PROVIDER: Emelia Loron, MD   PT End of Session - 09/02/22 0911     Visit Number 8   # unchanged due to screen only   PT Start Time 0909    PT Stop Time 0913    PT Time Calculation (min) 4 min    Activity Tolerance Patient tolerated treatment well    Behavior During Therapy [facility] for tasks assessed/performed             Past Medical History:  Diagnosis Date   Allergy    Seasonal   Anxiety    Hypertension    Past Surgical History:  Procedure Laterality Date   AXILLARY SENTINEL NODE BIOPSY Right 06/07/2022   Procedure: AXILLARY SENTINEL LYMPH NODE BIOPSY;  Surgeon: Emelia Loron, MD;  Location: [facility] OR;  Service: General;  Laterality: Right;   BREAST BIOPSY Right 05/02/2022   US biopsy/ ribbon clip/ path pending   BREAST BIOPSY Right 05/02/2022   [place] RT BREAST BX W LOC DEV 1ST LESION IMG BX SPEC US GUIDE 05/02/2022 [facility]   BREAST BIOPSY  06/04/2022   MM RT RADIOACTIVE SEED LOC MAMMO GUIDE 06/04/2022 [facility]   BREAST LUMPECTOMY WITH RADIOACTIVE SEED AND SENTINEL LYMPH NODE BIOPSY Right 06/07/2022   Procedure: RIGHT BREAST LUMPECTOMY WITH RADIOACTIVE SEED;  Surgeon: Emelia Loron, MD;  Location: [facility] OR;  Service: General;  Laterality: Right;   COLONOSCOPY     WISDOM TOOTH EXTRACTION Bilateral    Patient Active Problem List   Diagnosis Date Noted   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HCC) 05/07/2022   Allergic rhinitis 01/21/2018   Anxiety 08/09/2014   Overweight (BMI 25.0-29.9) 08/09/2014    REFERRING DIAG: right breast cancer at risk for lymphedema  THERAPY DIAG:  Aftercare following surgery for neoplasm  PERTINENT HISTORY: Patient was diagnosed on 05/02/22 with right grade 1. It measures 5 x 4 x 5 mm and is located in the upper-outer  quadrant. It is ER/PR+, Her2-. Ki67 not completed. 06/07/22: R breast lumpectomy SLNB 0/3   PRECAUTIONS: right UE Lymphedema risk, None  SUBJECTIVE: Pt returns for her 3 month L-Dex screen.   PAIN:  Are you having pain? No  SOZO SCREENING: Patient was assessed today using the SOZO machine to determine the lymphedema index score. This was compared to her baseline score. It was determined that she is within the recommended range when compared to her baseline and no further action is needed at this time. She will continue SOZO screenings. These are done every 3 months for 2 years post operatively followed by every 6 months for 2 years, and then annually.   L-DEX FLOWSHEETS - 09/02/22 0900       L-DEX LYMPHEDEMA SCREENING   Measurement Type Unilateral    L-DEX MEASUREMENT EXTREMITY Upper Extremity    POSITION  Standing    DOMINANT SIDE Left    At Risk Side Right    BASELINE SCORE (UNILATERAL) 9.4    L-DEX SCORE (UNILATERAL) 8.1    VALUE CHANGE (UNILAT) -1.3              Hermenia Bers, PTA 09/02/2022, 9:11 AM

## 2022-09-04 ENCOUNTER — Ambulatory Visit
Admission: RE | Admit: 2022-09-04 | Discharge: 2022-09-04 | Disposition: A | Discharge status: Home / Self Care | Payer: BC Managed Care – PPO | Source: Ambulatory Visit | Attending: Radiation Oncology | Admitting: Radiation Oncology

## 2022-09-04 VITALS — BP 143/93 | HR 76 | Temp 98.3°F | Wt 165.8 lb

## 2022-09-04 DIAGNOSIS — C50911 Malignant neoplasm of unspecified site of right female breast: Secondary | ICD-10-CM

## 2022-09-04 DIAGNOSIS — C50411 Malignant neoplasm of upper-outer quadrant of right female breast: Secondary | ICD-10-CM | POA: Insufficient documentation

## 2022-09-04 DIAGNOSIS — Z170 Estrogen receptor positive status [ER+]: Secondary | ICD-10-CM | POA: Insufficient documentation

## 2022-09-04 NOTE — Progress Notes (Signed)
Radiation Oncology Follow up Note  Name: Rhonda Charles   Date:   09/04/2022 MRN:  [id number] DOB: Aug 20, 1970    This 52 y.o. female presents to the clinic today for 1 month follow-up status post whole breast radiation to her right breast for stage Ia ER/PR positive invasive mammary carcinoma.  REFERRING PROVIDER: Carren Rang, PA-C  HPI: Patient is a 52 year old female now out 1 month having completed whole breast radiation to her right breast for stage I ER/PR positive invasive mammary carcinoma.  Seen today in routine follow-up she is doing well.  She specifically denies breast tenderness cough or bone pain..  She has been started on Femara tolerating it well without side effect.  COMPLICATIONS OF TREATMENT: none  FOLLOW UP COMPLIANCE: keeps appointments   PHYSICAL EXAM:  BP (!) 143/93 (BP Location: Left Arm, Patient Position: Sitting, Cuff Size: Normal)   Pulse 76   Temp 98.3 F (36.8 C) (Tympanic)   Wt 165 lb 12.8 oz (75.2 kg)   BMI 29.37 kg/m  Lungs are clear to A&P cardiac examination essentially unremarkable with regular rate and rhythm. No dominant mass or nodularity is noted in either breast in 2 positions examined. Incision is well-healed. No axillary or supraclavicular adenopathy is appreciated. Cosmetic result is excellent.  Well-developed well-nourished patient in NAD. HEENT reveals PERLA, EOMI, discs not visualized.  Oral cavity is clear. No oral mucosal lesions are identified. Neck is clear without evidence of cervical or supraclavicular adenopathy. Lungs are clear to A&P. Cardiac examination is essentially unremarkable with regular rate and rhythm without murmur rub or thrill. Abdomen is benign with no organomegaly or masses noted. Motor sensory and DTR levels are equal and symmetric in the upper and lower extremities. Cranial nerves II through XII are grossly intact. Proprioception is intact. No peripheral adenopathy or edema is identified. No motor or sensory levels  are noted. Crude visual fields are within normal range.  RADIOLOGY RESULTS: No current films for review  PLAN: Present time patient is doing well 1 month out from whole breast radiation and pleased with her overall progress.  Of asked to see her back in 6 months for follow-up.  She continues on Femara without side effect.  Patient is to call with any concerns.  I would like to take this opportunity to thank you for allowing me to participate in the care of your patient.Carmina Miller, MD

## 2022-09-04 NOTE — Progress Notes (Signed)
Survivorship Care Plan visit completed.  Treatment summary reviewed and given to patient.  ASCO answers booklet reviewed and given to patient.  CARE program and Cancer Transitions discussed with patient along with other resources cancer center offers to patients and caregivers.  Patient verbalized understanding.    

## 2022-09-30 ENCOUNTER — Ambulatory Visit
Admission: RE | Admit: 2022-09-30 | Discharge: 2022-09-30 | Disposition: A | Discharge status: Home / Self Care | Payer: BC Managed Care – PPO | Source: Ambulatory Visit | Attending: Oncology | Admitting: Oncology

## 2022-09-30 DIAGNOSIS — C50911 Malignant neoplasm of unspecified site of right female breast: Secondary | ICD-10-CM | POA: Insufficient documentation

## 2022-10-23 ENCOUNTER — Other Ambulatory Visit: Payer: Self-pay | Admitting: Oncology

## 2022-11-07 ENCOUNTER — Inpatient Hospital Stay: Payer: BC Managed Care – PPO | Attending: Oncology | Admitting: Oncology

## 2022-11-07 ENCOUNTER — Encounter: Payer: Self-pay | Admitting: Oncology

## 2022-11-07 VITALS — BP 130/74 | HR 73 | Temp 97.2°F | Resp 16 | Ht 63.0 in | Wt 164.0 lb

## 2022-11-07 DIAGNOSIS — Z79811 Long term (current) use of aromatase inhibitors: Secondary | ICD-10-CM | POA: Diagnosis not present

## 2022-11-07 DIAGNOSIS — Z79899 Other long term (current) drug therapy: Secondary | ICD-10-CM | POA: Insufficient documentation

## 2022-11-07 DIAGNOSIS — F17210 Nicotine dependence, cigarettes, uncomplicated: Secondary | ICD-10-CM | POA: Insufficient documentation

## 2022-11-07 DIAGNOSIS — C50911 Malignant neoplasm of unspecified site of right female breast: Secondary | ICD-10-CM | POA: Diagnosis not present

## 2022-11-07 DIAGNOSIS — Z170 Estrogen receptor positive status [ER+]: Secondary | ICD-10-CM | POA: Insufficient documentation

## 2022-11-07 NOTE — Progress Notes (Signed)
Having hair loss, constipation, and joint stiffness. Concerned about it coming from letrazole.

## 2022-11-07 NOTE — Progress Notes (Signed)
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  Oncotype score Dx 22, low risk.  INTERVAL HISTORY: Patient returns to clinic today for routine 53-month evaluation and to assess her toleration of letrozole.  She has noticed mild hot flashes, joint stiffness, and hair thinning since initiating treatment.  She otherwise feels well. She has no neurologic complaints.  She denies any recent fevers or illnesses.  She has a good appetite and denies weight loss.  She has no chest pain, shortness of breath, cough, or hemoptysis.  She denies any nausea, vomiting, constipation, or diarrhea.  She has no urinary complaints.  Patient offers no further specific complaints today.  REVIEW OF SYSTEMS:   Review of Systems  Constitutional: Negative.  Negative for fever, malaise/fatigue and weight loss.  Respiratory: Negative.  Negative for cough, hemoptysis and shortness of breath.   Cardiovascular: Negative.  Negative for chest pain and leg swelling.  Gastrointestinal: Negative.  Negative for abdominal pain.  Genitourinary: Negative.  Negative for dysuria.  Musculoskeletal: Negative.  Negative for back pain.  Skin: Negative.  Negative for rash.  Neurological: Negative.  Negative for dizziness, focal weakness, weakness and headaches.  Psychiatric/Behavioral: Negative.  The patient is not nervous/anxious.     As per HPI. Otherwise, a complete review of systems is negative.  PAST MEDICAL  HISTORY: Past Medical History:  Diagnosis Date   Allergy    Seasonal   Anxiety    Hypertension     PAST SURGICAL HISTORY: Past Surgical History:  Procedure Laterality Date   AXILLARY SENTINEL NODE BIOPSY Right 06/07/2022   Procedure: AXILLARY SENTINEL LYMPH NODE BIOPSY;  Surgeon: Emelia Loron, MD;  Location: [facility] OR;  Service: General;  Laterality: Right;   BREAST BIOPSY Right 05/02/2022   US biopsy/ ribbon clip/ path pending   BREAST BIOPSY Right 05/02/2022   [place] RT BREAST BX W LOC DEV 1ST LESION IMG BX SPEC US GUIDE 05/02/2022 [facility]   BREAST BIOPSY  06/04/2022   MM RT RADIOACTIVE SEED LOC MAMMO GUIDE 06/04/2022 [facility]   BREAST LUMPECTOMY WITH RADIOACTIVE SEED AND SENTINEL LYMPH NODE BIOPSY Right 06/07/2022   Procedure: RIGHT BREAST LUMPECTOMY WITH RADIOACTIVE SEED;  Surgeon: Emelia Loron, MD;  Location: [facility] OR;  Service: General;  Laterality: Right;   COLONOSCOPY     WISDOM TOOTH EXTRACTION Bilateral     FAMILY HISTORY: Family History  Problem Relation Age of Onset   Breast cancer Mother 2   Anxiety disorder Mother    Arthritis Mother    Cancer Mother    Diabetes Mother    Breast cancer Maternal Grandmother 61   Cancer Maternal Grandmother    Varicose Veins Maternal Grandmother     ADVANCED DIRECTIVES (Y/N):  N  HEALTH MAINTENANCE: Social History   Tobacco Use   Smoking status: Every Day    Current packs/day: 0.25    Average packs/day: 0.3 packs/day for 15.0 years (3.8 ttl pk-yrs)    Types: Cigarettes   Tobacco comments:    In a program  Substance Use Topics   Alcohol use: Yes  Alcohol/week: 1.0 standard drink of alcohol    Types: 1 Cans of beer per week   Drug use: Never     Colonoscopy:  PAP:  Bone density:  Lipid panel:  No Known Allergies  Current Outpatient Medications  Medication Sig Dispense Refill   b complex vitamins capsule Take 1 capsule by mouth daily.     busPIRone (BUSPAR) 7.5 MG tablet Take 7.5 mg by  mouth 2 (two) times daily.     cetirizine (ZYRTEC) 10 MG tablet Take 10 mg by mouth daily.     Cholecalciferol (VITAMIN D-1000 MAX ST) 25 MCG (1000 UT) tablet Take 1,000 Units by mouth daily.     clonazePAM (KLONOPIN) 0.5 MG tablet Take 0.5 mg by mouth daily as needed for anxiety.     escitalopram (LEXAPRO) 20 MG tablet Take 20 mg by mouth daily.     fluticasone (FLONASE) 50 MCG/ACT nasal spray Place 2 sprays into both nostrils daily as needed for allergies.     letrozole (FEMARA) 2.5 MG tablet TAKE ONE TABLET BY MOUTH EVERY DAY 30 tablet 3   losartan (COZAAR) 50 MG tablet Take 50 mg by mouth daily.     montelukast (SINGULAIR) 10 MG tablet Take 10 mg by mouth at bedtime.     traZODone (DESYREL) 50 MG tablet Take 50 mg by mouth at bedtime.     nicotine (NICODERM CQ - DOSED IN MG/24 HOURS) 14 mg/24hr patch Place 14 mg onto the skin daily. (Patient not taking: Reported on 08/01/2022)     No current facility-administered medications for this visit.    OBJECTIVE: Vitals:   11/07/22 1451  BP: 130/74  Pulse: 73  Resp: 16  Temp: (!) 97.2 F (36.2 C)  SpO2: 98%     Body mass index is 29.05 kg/m.    ECOG FS:0 - Asymptomatic  General: Well-developed, well-nourished, no acute distress. Eyes: Pink conjunctiva, anicteric sclera. HEENT: Normocephalic, moist mucous membranes. Breasts: Exam deferred today. Lungs: No audible wheezing or coughing. Heart: Regular rate and rhythm. Abdomen: Soft, nontender, no obvious distention. Musculoskeletal: No edema, cyanosis, or clubbing. Neuro: Alert, answering all questions appropriately. Cranial nerves grossly intact. Skin: No rashes or petechiae noted. Psych: Normal affect.   LAB RESULTS:  Lab Results  Component Value Date   NA 137 05/31/2022   K 4.0 05/31/2022   CL 101 05/31/2022   CO2 26 05/31/2022   GLUCOSE 102 (H) 05/31/2022   BUN 7 05/31/2022   CREATININE 0.80 05/31/2022   CALCIUM 9.2 05/31/2022   GFRNONAA >60 05/31/2022    Lab Results   Component Value Date   WBC 5.2 07/25/2022   HGB 13.6 07/25/2022   HCT 41.3 07/25/2022   MCV 92.8 07/25/2022   PLT 273 07/25/2022     STUDIES: No results found.  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  Oncotype score Dx 22, low risk.  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  Given her low risk Oncotype Dx score of 22, adjuvant chemotherapy was not necessary.  Patient completed adjuvant XRT on August 01, 2022.  Continue letrozole for a total of 5 years completing in April 2029.  Return to clinic in 6 months for routine evaluation.   Bone health: Baseline bone mineral density on September 30, 2022 reported T-score of -1.0 which is considered normal.  Repeat in June 2026.  Hot flashes/joint stiffness/hair thinning: Likely related to letrozole.  After lengthy discussion with the patient she does not wish to change treatment at this time.  If her symptoms become too bothersome, she will call clinic at which time we can switch her to anastrozole.     Patient expressed understanding and was in agreement with this plan. She also understands that She can call clinic at any time with any questions, concerns, or complaints.    Cancer Staging  Invasive ductal carcinoma of right breast [facility]) Staging form: Breast, AJCC 8th Edition - Pathologic stage from 07/03/2022: Stage IA (pT1c, pN0, cM0, G1, ER+, PR+, HER2-, Oncotype DX score: 22) - Signed by Jeralyn Ruths, MD on 07/03/2022 Stage prefix: Initial diagnosis Multigene prognostic tests performed: Oncotype DX Recurrence score range: Greater than or equal to 11 Histologic grading system: 3 grade system   Jeralyn Ruths, MD   11/07/2022 3:07 PM

## 2022-12-02 ENCOUNTER — Encounter: Payer: Self-pay | Admitting: Oncology

## 2022-12-03 ENCOUNTER — Other Ambulatory Visit: Payer: Self-pay | Admitting: *Deleted

## 2022-12-09 ENCOUNTER — Ambulatory Visit: Payer: BC Managed Care – PPO | Attending: General Surgery

## 2022-12-09 ENCOUNTER — Ambulatory Visit: Payer: BC Managed Care – PPO

## 2022-12-09 VITALS — Wt 166.2 lb

## 2022-12-09 DIAGNOSIS — Z483 Aftercare following surgery for neoplasm: Secondary | ICD-10-CM | POA: Insufficient documentation

## 2022-12-09 NOTE — Therapy (Signed)
  OUTPATIENT PHYSICAL THERAPY SOZO SCREENING NOTE   Patient Name: Rhonda Charles MRN: [id number] DOB:06/04/1970, 52 y.o., female Today's Date: 12/09/2022  PCP: Carren Rang, PA-C REFERRING PROVIDER: Emelia Loron, MD   PT End of Session - 12/09/22 1607     Visit Number 8   # unchanged due to screen only   PT Start Time 0605    PT Stop Time 0610    PT Time Calculation (min) 5 min    Activity Tolerance Patient tolerated treatment well    Behavior During Therapy [facility] for tasks assessed/performed             Past Medical History:  Diagnosis Date   Allergy    Seasonal   Anxiety    Hypertension    Past Surgical History:  Procedure Laterality Date   AXILLARY SENTINEL NODE BIOPSY Right 06/07/2022   Procedure: AXILLARY SENTINEL LYMPH NODE BIOPSY;  Surgeon: Emelia Loron, MD;  Location: [facility] OR;  Service: General;  Laterality: Right;   BREAST BIOPSY Right 05/02/2022   US biopsy/ ribbon clip/ path pending   BREAST BIOPSY Right 05/02/2022   [place] RT BREAST BX W LOC DEV 1ST LESION IMG BX SPEC US GUIDE 05/02/2022 [facility]   BREAST BIOPSY  06/04/2022   MM RT RADIOACTIVE SEED LOC MAMMO GUIDE 06/04/2022 [facility]   BREAST LUMPECTOMY WITH RADIOACTIVE SEED AND SENTINEL LYMPH NODE BIOPSY Right 06/07/2022   Procedure: RIGHT BREAST LUMPECTOMY WITH RADIOACTIVE SEED;  Surgeon: Emelia Loron, MD;  Location: [facility];  Service: General;  Laterality: Right;   COLONOSCOPY     WISDOM TOOTH EXTRACTION Bilateral    Patient Active Problem List   Diagnosis Date Noted   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HCC) 05/07/2022   Allergic rhinitis 01/21/2018   Anxiety 08/09/2014   Overweight (BMI 25.0-29.9) 08/09/2014    REFERRING DIAG: right breast cancer at risk for lymphedema  THERAPY DIAG: Aftercare following surgery for neoplasm  PERTINENT HISTORY: Patient was diagnosed on 05/02/22 with right grade 1. It measures 5 x 4 x 5 mm and is located in the upper-outer  quadrant. It is ER/PR+, Her2-. Ki67 not completed. 06/07/22: R breast lumpectomy SLNB 0/3   PRECAUTIONS: right UE Lymphedema risk, None  SUBJECTIVE: Pt returns for her 3 month L-Dex screen.   PAIN:  Are you having pain? No  SOZO SCREENING: Patient was assessed today using the SOZO machine to determine the lymphedema index score. This was compared to her baseline score. It was determined that she is within the recommended range when compared to her baseline and no further action is needed at this time. She will continue SOZO screenings. These are done every 3 months for 2 years post operatively followed by every 6 months for 2 years, and then annually.   L-DEX FLOWSHEETS - 12/09/22 1600       L-DEX LYMPHEDEMA SCREENING   Measurement Type Unilateral    L-DEX MEASUREMENT EXTREMITY Upper Extremity    POSITION  Standing    DOMINANT SIDE Left    At Risk Side Right    BASELINE SCORE (UNILATERAL) 9.4    L-DEX SCORE (UNILATERAL) 8.8    VALUE CHANGE (UNILAT) -0.6              Hermenia Bers, PTA 12/09/2022, 4:13 PM

## 2023-01-02 ENCOUNTER — Encounter: Payer: Self-pay | Admitting: Oncology

## 2023-01-02 ENCOUNTER — Encounter: Payer: Self-pay | Admitting: *Deleted

## 2023-02-25 ENCOUNTER — Other Ambulatory Visit: Payer: Self-pay | Admitting: Oncology

## 2023-03-11 ENCOUNTER — Other Ambulatory Visit: Payer: Self-pay | Admitting: Student

## 2023-03-11 DIAGNOSIS — Z853 Personal history of malignant neoplasm of breast: Secondary | ICD-10-CM

## 2023-03-17 ENCOUNTER — Ambulatory Visit
Admission: RE | Admit: 2023-03-17 | Discharge: 2023-03-17 | Disposition: A | Discharge status: Home / Self Care | Payer: BC Managed Care – PPO | Source: Ambulatory Visit | Attending: Radiation Oncology | Admitting: Radiation Oncology

## 2023-03-17 VITALS — BP 106/70 | HR 82 | Resp 17

## 2023-03-17 DIAGNOSIS — C50411 Malignant neoplasm of upper-outer quadrant of right female breast: Secondary | ICD-10-CM | POA: Diagnosis present

## 2023-03-17 DIAGNOSIS — Z923 Personal history of irradiation: Secondary | ICD-10-CM | POA: Diagnosis not present

## 2023-03-17 DIAGNOSIS — Z170 Estrogen receptor positive status [ER+]: Secondary | ICD-10-CM | POA: Diagnosis not present

## 2023-03-17 DIAGNOSIS — Z79811 Long term (current) use of aromatase inhibitors: Secondary | ICD-10-CM | POA: Diagnosis not present

## 2023-03-17 NOTE — Progress Notes (Signed)
Radiation Oncology Follow up Note  Name: PERCY LUDWICK   Date:   03/17/2023 MRN:  [id number] DOB: May 24, 1970    This 52 y.o. female presents to the clinic today for 43-month follow-up status post whole breast radiation to her right breast for stage Ia ER/PR positive invasive mammary carcinoma.  REFERRING PROVIDER: Carren Rang, PA-C  HPI: Patient is a 52 year old female now out 7 months having completed whole breast radiation to her right breast for stage Ia ER positive invasive mammary carcinoma seen today in routine follow-up she is doing well.  She specifically denies  cough or bone pain..  She is having some occasional soreness in the breast which I have assured her is scar tissue which could take years to resolve.  She is also having some problems of her right thumb and some increased arthritic type pain she may attributed to her endocrine therapy.  She has mammogram scheduled for December.  She is currently on Femara tolerating it well without side effect  COMPLICATIONS OF TREATMENT: none  FOLLOW UP COMPLIANCE: keeps appointments   PHYSICAL EXAM:  BP 106/70 (Patient Position: Sitting)   Pulse 82   Resp 17   SpO2 97%  Lungs are clear to A&P cardiac examination essentially unremarkable with regular rate and rhythm. No dominant mass or nodularity is noted in either breast in 2 positions examined. Incision is well-healed. No axillary or supraclavicular adenopathy is appreciated. Cosmetic result is excellent.  Well-developed well-nourished patient in NAD. HEENT reveals PERLA, EOMI, discs not visualized.  Oral cavity is clear. No oral mucosal lesions are identified. Neck is clear without evidence of cervical or supraclavicular adenopathy. Lungs are clear to A&P. Cardiac examination is essentially unremarkable with regular rate and rhythm without murmur rub or thrill. Abdomen is benign with no organomegaly or masses noted. Motor sensory and DTR levels are equal and symmetric in the upper  and lower extremities. Cranial nerves II through XII are grossly intact. Proprioception is intact. No peripheral adenopathy or edema is identified. No motor or sensory levels are noted. Crude visual fields are within normal range.  RADIOLOGY RESULTS: No current films for review  PLAN: Present time patient is doing well 7 months out from whole breast radiation and pleased with her overall progress.  She continues on Femara without side effect.  Of asked to see her back in 6 months for follow-up and then we will start once year follow-up visits.  Patient knows to call with any concerns at any time.  I would like to take this opportunity to thank you for allowing me to participate in the care of your patient.Carmina Miller, MD

## 2023-03-17 NOTE — Addendum Note (Signed)
Encounter addended by: Virgina Norfolk, RN on: 03/17/2023 2:53 PM  Actions taken: Flowsheet accepted

## 2023-03-31 ENCOUNTER — Ambulatory Visit: Payer: BC Managed Care – PPO | Attending: General Surgery

## 2023-03-31 VITALS — Wt 163.1 lb

## 2023-03-31 DIAGNOSIS — Z483 Aftercare following surgery for neoplasm: Secondary | ICD-10-CM | POA: Insufficient documentation

## 2023-03-31 NOTE — Therapy (Signed)
  OUTPATIENT PHYSICAL THERAPY SOZO SCREENING NOTE   Patient Name: Rhonda Charles MRN: [id number] DOB:09-21-1970, 52 y.o., female Today's Date: 03/31/2023  PCP: Carren Rang, PA-C REFERRING PROVIDER: Emelia Loron, MD   PT End of Session - 03/31/23 1619     Visit Number 8   # unchanged due to screen only   PT Start Time 1617    PT Stop Time 1621    PT Time Calculation (min) 4 min    Activity Tolerance Patient tolerated treatment well    Behavior During Therapy [facility] for tasks assessed/performed             Past Medical History:  Diagnosis Date   Allergy    Seasonal   Anxiety    Hypertension    Past Surgical History:  Procedure Laterality Date   AXILLARY SENTINEL NODE BIOPSY Right 06/07/2022   Procedure: AXILLARY SENTINEL LYMPH NODE BIOPSY;  Surgeon: Emelia Loron, MD;  Location: [facility];  Service: General;  Laterality: Right;   BREAST BIOPSY Right 05/02/2022   US biopsy/ ribbon clip/ path pending   BREAST BIOPSY Right 05/02/2022   [place] RT BREAST BX W LOC DEV 1ST LESION IMG BX SPEC US GUIDE 05/02/2022 [facility]   BREAST BIOPSY  06/04/2022   MM RT RADIOACTIVE SEED LOC MAMMO GUIDE 06/04/2022 [facility]   BREAST LUMPECTOMY WITH RADIOACTIVE SEED AND SENTINEL LYMPH NODE BIOPSY Right 06/07/2022   Procedure: RIGHT BREAST LUMPECTOMY WITH RADIOACTIVE SEED;  Surgeon: Emelia Loron, MD;  Location: [facility] OR;  Service: General;  Laterality: Right;   COLONOSCOPY     WISDOM TOOTH EXTRACTION Bilateral    Patient Active Problem List   Diagnosis Date Noted   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HCC) 05/07/2022   Allergic rhinitis 01/21/2018   Anxiety 08/09/2014   Overweight (BMI 25.0-29.9) 08/09/2014    REFERRING DIAG: right breast cancer at risk for lymphedema  THERAPY DIAG: Aftercare following surgery for neoplasm  PERTINENT HISTORY: Patient was diagnosed on 05/02/22 with right grade 1. It measures 5 x 4 x 5 mm and is located in the upper-outer  quadrant. It is ER/PR+, Her2-. Ki67 not completed. 06/07/22: R breast lumpectomy SLNB 0/3   PRECAUTIONS: right UE Lymphedema risk, None  SUBJECTIVE: Pt returns for her 3 month L-Dex screen.   PAIN:  Are you having pain? No  SOZO SCREENING: Patient was assessed today using the SOZO machine to determine the lymphedema index score. This was compared to her baseline score. It was determined that she is within the recommended range when compared to her baseline and no further action is needed at this time. She will continue SOZO screenings. These are done every 3 months for 2 years post operatively followed by every 6 months for 2 years, and then annually.   L-DEX FLOWSHEETS - 03/31/23 1600       L-DEX LYMPHEDEMA SCREENING   Measurement Type Unilateral    L-DEX MEASUREMENT EXTREMITY Upper Extremity    POSITION  Standing    DOMINANT SIDE Left    At Risk Side Right    BASELINE SCORE (UNILATERAL) 9.4    L-DEX SCORE (UNILATERAL) 7.2    VALUE CHANGE (UNILAT) -2.2              Hermenia Bers, PTA 03/31/2023, 4:20 PM

## 2023-04-01 ENCOUNTER — Encounter: Payer: Self-pay | Admitting: Oncology

## 2023-04-01 ENCOUNTER — Other Ambulatory Visit: Payer: Self-pay | Admitting: *Deleted

## 2023-04-01 MED ORDER — LETROZOLE 2.5 MG PO TABS: 2.5000 mg | ORAL_TABLET | Freq: Every day | ORAL | 3 refills | Status: DC

## 2023-04-04 ENCOUNTER — Ambulatory Visit
Admission: RE | Admit: 2023-04-04 | Discharge: 2023-04-04 | Disposition: A | Discharge status: Home / Self Care | Payer: BC Managed Care – PPO | Source: Ambulatory Visit | Attending: Student

## 2023-04-04 DIAGNOSIS — Z853 Personal history of malignant neoplasm of breast: Secondary | ICD-10-CM | POA: Diagnosis present

## 2023-04-04 HISTORY — DX: Personal history of irradiation: Z92.3

## 2023-05-13 ENCOUNTER — Ambulatory Visit: Payer: BC Managed Care – PPO | Admitting: Oncology

## 2023-06-30 ENCOUNTER — Encounter: Payer: Self-pay | Admitting: Oncology

## 2023-06-30 ENCOUNTER — Other Ambulatory Visit: Payer: Self-pay | Admitting: *Deleted

## 2023-06-30 ENCOUNTER — Ambulatory Visit: Payer: BC Managed Care – PPO | Attending: General Surgery

## 2023-06-30 VITALS — Wt 167.4 lb

## 2023-06-30 DIAGNOSIS — Z483 Aftercare following surgery for neoplasm: Secondary | ICD-10-CM | POA: Insufficient documentation

## 2023-06-30 MED ORDER — LETROZOLE 2.5 MG PO TABS: 2.5000 mg | ORAL_TABLET | Freq: Every day | ORAL | 12 refills | Status: DC

## 2023-06-30 NOTE — Therapy (Signed)
 OUTPATIENT PHYSICAL THERAPY SOZO SCREENING NOTE   Patient Name: Rhonda Charles MRN: [id number] DOB:1970-10-01, 53 y.o., female Today's Date: 06/30/2023  PCP: Carren Rang, PA-C REFERRING PROVIDER: Emelia Loron, MD   PT End of Session - 06/30/23 1555     Visit Number 8   # unchanged due to screen only   PT Start Time 1554    PT Stop Time 1558    PT Time Calculation (min) 4 min    Activity Tolerance Patient tolerated treatment well    Behavior During Therapy [facility] for tasks assessed/performed             Past Medical History:  Diagnosis Date   Allergy    Seasonal   Anxiety    Hypertension    Personal history of radiation therapy    Past Surgical History:  Procedure Laterality Date   AXILLARY SENTINEL NODE BIOPSY Right 06/07/2022   Procedure: AXILLARY SENTINEL LYMPH NODE BIOPSY;  Surgeon: Emelia Loron, MD;  Location: [facility] OR;  Service: General;  Laterality: Right;   BREAST BIOPSY Right 05/02/2022   US biopsy/ ribbon clip/ NVASIVE MAMMARY CARCINOMA, NO SPECIAL TYPE. 5 mm in this sample. Grade 1 (of 3). 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 Lymphovascular invasion: Not identified.   BREAST BIOPSY Right 05/02/2022   [place] RT BREAST BX W LOC DEV 1ST LESION IMG BX SPEC US GUIDE 05/02/2022 [facility]   BREAST BIOPSY  06/04/2022   MM RT RADIOACTIVE SEED LOC MAMMO GUIDE 06/04/2022 [facility]   BREAST LUMPECTOMY WITH RADIOACTIVE SEED AND SENTINEL LYMPH NODE BIOPSY Right 06/07/2022   Procedure: RIGHT BREAST LUMPECTOMY WITH RADIOACTIVE SEED;  Surgeon: Emelia Loron, MD;  Location: [facility] OR;  Service: General;  Laterality: Right;   COLONOSCOPY     WISDOM TOOTH EXTRACTION Bilateral    Patient Active Problem List   Diagnosis Date Noted   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HCC) 05/07/2022   Allergic rhinitis 01/21/2018   Anxiety 08/09/2014   Overweight (BMI 25.0-29.9) 08/09/2014    REFERRING DIAG: right breast cancer  at risk for lymphedema  THERAPY DIAG: Aftercare following surgery for neoplasm  PERTINENT HISTORY: Patient was diagnosed on 05/02/22 with right grade 1. It measures 5 x 4 x 5 mm and is located in the upper-outer quadrant. It is ER/PR+, Her2-. Ki67 not completed. 06/07/22: R breast lumpectomy SLNB 0/3   PRECAUTIONS: right UE Lymphedema risk, None  SUBJECTIVE: Pt returns for her 3 month L-Dex screen.   PAIN:  Are you having pain? No  SOZO SCREENING: Patient was assessed today using the SOZO machine to determine the lymphedema index score. This was compared to her baseline score. It was determined that she is within the recommended range when compared to her baseline and no further action is needed at this time. She will continue SOZO screenings. These are done every 3 months for 2 years post operatively followed by every 6 months for 2 years, and then annually.   L-DEX FLOWSHEETS - 06/30/23 1500       L-DEX LYMPHEDEMA SCREENING   Measurement Type Unilateral    L-DEX MEASUREMENT EXTREMITY Upper Extremity    POSITION  Standing    DOMINANT SIDE Left    At Risk Side Right    BASELINE SCORE (UNILATERAL) 9.4    L-DEX SCORE (UNILATERAL) 9.2    VALUE CHANGE (UNILAT) -0.2              Hermenia Bers, PTA 06/30/2023, 3:57 PM

## 2023-09-29 ENCOUNTER — Ambulatory Visit
Admission: RE | Admit: 2023-09-29 | Discharge: 2023-09-29 | Disposition: A | Discharge status: Home / Self Care | Payer: BC Managed Care – PPO | Source: Ambulatory Visit | Attending: Radiation Oncology | Admitting: Radiation Oncology

## 2023-09-29 VITALS — BP 110/66 | HR 70 | Temp 98.0°F | Resp 16 | Wt 164.0 lb

## 2023-09-29 DIAGNOSIS — C50411 Malignant neoplasm of upper-outer quadrant of right female breast: Secondary | ICD-10-CM | POA: Diagnosis present

## 2023-09-29 DIAGNOSIS — Z170 Estrogen receptor positive status [ER+]: Secondary | ICD-10-CM | POA: Insufficient documentation

## 2023-09-29 DIAGNOSIS — Z79811 Long term (current) use of aromatase inhibitors: Secondary | ICD-10-CM | POA: Insufficient documentation

## 2023-09-29 DIAGNOSIS — Z923 Personal history of irradiation: Secondary | ICD-10-CM | POA: Insufficient documentation

## 2023-09-29 NOTE — Progress Notes (Signed)
 Radiation Oncology Follow up Note  Name: Rhonda Charles   Date:   09/29/2023 MRN:  [id number] DOB: 08/21/1970    This 53 y.o. female presents to the clinic today for 52-month follow-up status post whole breast radiation to her right breast for stage Ia ER/PR positive invasive mammary carcinoma.  REFERRING PROVIDER: Morton Areas, PA-C  HPI: Patient is a 53 year old female now out 13 months having completed whole breast radiation to her right breast for stage Ia ER/PR positive invasive mammary carcinoma.  Seen today in routine follow-up she is doing well still has some pain and tenderness in the right axilla consistent with her sentinel node surgery.  Otherwise is without complaint..  She had mammograms back in December which I have reviewed were BI-RADS 2 benign.  She is currently on Femara  tolerating it well without side effect.  COMPLICATIONS OF TREATMENT: none  FOLLOW UP COMPLIANCE: keeps appointments   PHYSICAL EXAM:  BP 110/66   Pulse 70   Temp 98 F (36.7 C) (Tympanic)   Resp 16   Wt 164 lb (74.4 kg)   LMP 05/24/2022   BMI 29.05 kg/m  Lungs are clear to A&P cardiac examination essentially unremarkable with regular rate and rhythm. No dominant mass or nodularity is noted in either breast in 2 positions examined. Incision is well-healed. No axillary or supraclavicular adenopathy is appreciated. Cosmetic result is excellent.  Well-developed well-nourished patient in NAD. HEENT reveals PERLA, EOMI, discs not visualized.  Oral cavity is clear. No oral mucosal lesions are identified. Neck is clear without evidence of cervical or supraclavicular adenopathy. Lungs are clear to A&P. Cardiac examination is essentially unremarkable with regular rate and rhythm without murmur rub or thrill. Abdomen is benign with no organomegaly or masses noted. Motor sensory and DTR levels are equal and symmetric in the upper and lower extremities. Cranial nerves II through XII are grossly intact.  Proprioception is intact. No peripheral adenopathy or edema is identified. No motor or sensory levels are noted. Crude visual fields are within normal range.  RADIOLOGY RESULTS: Mammograms reviewed compatible with above-stated findings  PLAN: At the present time patient is doing well no evidence of disease now at 13 months.  Of asked to see her back in 1 year for follow-up.  Patient continues on Femara  without side effect.  Patient knows to call with any concerns I have emphasized to use her arm to break up scar tissue in her right axilla is much as possible.  Patient knows to call with any concerns.  I would like to take this opportunity to thank you for allowing me to participate in the care of your patient.Glenis Langdon, MD

## 2023-10-13 ENCOUNTER — Ambulatory Visit

## 2024-02-10 ENCOUNTER — Inpatient Hospital Stay: Payer: BC Managed Care – PPO | Attending: Oncology | Admitting: Oncology

## 2024-02-10 ENCOUNTER — Encounter: Payer: Self-pay | Admitting: Oncology

## 2024-02-10 VITALS — BP 124/72 | HR 74 | Temp 99.0°F | Resp 18 | Wt 160.5 lb

## 2024-02-10 DIAGNOSIS — Z1732 Human epidermal growth factor receptor 2 negative status: Secondary | ICD-10-CM | POA: Insufficient documentation

## 2024-02-10 DIAGNOSIS — Z923 Personal history of irradiation: Secondary | ICD-10-CM | POA: Insufficient documentation

## 2024-02-10 DIAGNOSIS — C50911 Malignant neoplasm of unspecified site of right female breast: Secondary | ICD-10-CM | POA: Diagnosis not present

## 2024-02-10 DIAGNOSIS — Z170 Estrogen receptor positive status [ER+]: Secondary | ICD-10-CM | POA: Insufficient documentation

## 2024-02-10 DIAGNOSIS — Z1721 Progesterone receptor positive status: Secondary | ICD-10-CM | POA: Diagnosis not present

## 2024-02-10 DIAGNOSIS — Z79811 Long term (current) use of aromatase inhibitors: Secondary | ICD-10-CM | POA: Insufficient documentation

## 2024-02-10 MED ORDER — ANASTROZOLE 1 MG PO TABS: 1.0000 mg | ORAL_TABLET | Freq: Every day | ORAL | 3 refills | Status: AC

## 2024-02-10 NOTE — Progress Notes (Unsigned)
 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  Oncotype score Dx 22, low risk.  INTERVAL HISTORY: Patient returns to clinic today for routine 42-month evaluation and to assess her toleration of letrozole .  She has noticed mild hot flashes, joint stiffness, and hair thinning since initiating treatment.  She otherwise feels well. She has no neurologic complaints.  She denies any recent fevers or illnesses.  She has a good appetite and denies weight loss.  She has no chest pain, shortness of breath, cough, or hemoptysis.  She denies any nausea, vomiting, constipation, or diarrhea.  She has no urinary complaints.  Patient offers no further specific complaints today.  REVIEW OF SYSTEMS:   Review of Systems  Constitutional: Negative.  Negative for fever, malaise/fatigue and weight loss.  Respiratory: Negative.  Negative for cough, hemoptysis and shortness of breath.   Cardiovascular: Negative.  Negative for chest pain and leg swelling.  Gastrointestinal: Negative.  Negative for abdominal pain.  Genitourinary: Negative.  Negative for dysuria.  Musculoskeletal: Negative.  Negative for back pain.  Skin: Negative.  Negative for rash.  Neurological: Negative.  Negative for dizziness, focal weakness, weakness and headaches.  Psychiatric/Behavioral: Negative.  The patient is not nervous/anxious.     As per HPI. Otherwise, a complete review of systems is negative.  PAST MEDICAL  HISTORY: Past Medical History:  Diagnosis Date   Allergy    Seasonal   Anxiety    Hypertension    Personal history of radiation therapy     PAST SURGICAL HISTORY: Past Surgical History:  Procedure Laterality Date   AXILLARY SENTINEL NODE BIOPSY Right 06/07/2022   Procedure: AXILLARY SENTINEL LYMPH NODE BIOPSY;  Surgeon: Ebbie Cough, MD;  Location: [facility] OR;  Service: General;  Laterality: Right;   BREAST BIOPSY Right 05/02/2022   us  biopsy/ ribbon clip/ NVASIVE MAMMARY CARCINOMA, NO SPECIAL TYPE. 5 mm in this sample. Grade 1 (of 3). 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 Lymphovascular invasion: Not identified.   BREAST BIOPSY Right 05/02/2022   US  RT BREAST BX W LOC DEV 1ST LESION IMG BX SPEC US  GUIDE 05/02/2022 [facility]   BREAST BIOPSY  06/04/2022   MM RT RADIOACTIVE SEED LOC MAMMO GUIDE 06/04/2022 [facility]   BREAST LUMPECTOMY WITH RADIOACTIVE SEED AND SENTINEL LYMPH NODE BIOPSY Right 06/07/2022   Procedure: RIGHT BREAST LUMPECTOMY WITH RADIOACTIVE SEED;  Surgeon: Ebbie Cough, MD;  Location[facility];  Service: General;  Laterality: Right;   COLONOSCOPY     WISDOM TOOTH EXTRACTION Bilateral     FAMILY HISTORY: Family History  Problem Relation Age of Onset   Breast cancer Mother 32   Anxiety disorder Mother    Arthritis Mother    Cancer Mother    Diabetes Mother    Breast cancer Maternal Grandmother 40   Cancer Maternal Grandmother    Varicose Veins Maternal Grandmother    Breast cancer Cousin 57 - 29    ADVANCED DIRECTIVES (Y/N):  N  HEALTH MAINTENANCE: Social History   Tobacco Use   Smoking status: Every Day  Current packs/day: 0.25    Average packs/day: 0.3 packs/day for 15.0 years (3.8 ttl pk-yrs)    Types: Cigarettes   Tobacco comments:    In a program  Substance Use Topics   Alcohol use: Yes    Alcohol/week: 1.0 standard drink of alcohol    Types: 1 Cans of beer per week   Drug use: Never      Colonoscopy:  PAP:  Bone density:  Lipid panel:  No Known Allergies  Current Outpatient Medications  Medication Sig Dispense Refill   b complex vitamins capsule Take 1 capsule by mouth daily.     busPIRone (BUSPAR) 7.5 MG tablet Take 7.5 mg by mouth 2 (two) times daily.     cetirizine (ZYRTEC) 10 MG tablet Take 10 mg by mouth daily.     Cholecalciferol (VITAMIN D-1000 MAX ST) 25 MCG (1000 UT) tablet Take 1,000 Units by mouth daily.     clonazePAM (KLONOPIN) 0.5 MG tablet Take 0.5 mg by mouth daily as needed for anxiety.     escitalopram (LEXAPRO) 20 MG tablet Take 20 mg by mouth daily.     fluticasone (FLONASE) 50 MCG/ACT nasal spray Place 2 sprays into both nostrils daily as needed for allergies.     letrozole  (FEMARA ) 2.5 MG tablet Take 1 tablet (2.5 mg total) by mouth daily. 30 tablet 12   losartan (COZAAR) 50 MG tablet Take 50 mg by mouth daily.     montelukast (SINGULAIR) 10 MG tablet Take 10 mg by mouth at bedtime.     traZODone (DESYREL) 50 MG tablet Take 50 mg by mouth at bedtime.     No current facility-administered medications for this visit.    OBJECTIVE: Vitals:   02/10/24 1419  BP: 124/72  Pulse: 74  Resp: 18  Temp: 99 F (37.2 C)  SpO2: 99%     Body mass index is 28.43 kg/m.    ECOG FS:0 - Asymptomatic  General: Well-developed, well-nourished, no acute distress. Eyes: Pink conjunctiva, anicteric sclera. HEENT: Normocephalic, moist mucous membranes. Breasts: Exam deferred today. Lungs: No audible wheezing or coughing. Heart: Regular rate and rhythm. Abdomen: Soft, nontender, no obvious distention. Musculoskeletal: No edema, cyanosis, or clubbing. Neuro: Alert, answering all questions appropriately. Cranial nerves grossly intact. Skin: No rashes or petechiae noted. Psych: Normal affect.   LAB RESULTS:  Lab Results  Component Value Date   NA 137 05/31/2022   K 4.0 05/31/2022   CL 101 05/31/2022   CO2 26 05/31/2022   GLUCOSE 102 (H) 05/31/2022    BUN 7 05/31/2022   CREATININE 0.80 05/31/2022   CALCIUM 9.2 05/31/2022   GFRNONAA >60 05/31/2022    Lab Results  Component Value Date   WBC 5.2 07/25/2022   HGB 13.6 07/25/2022   HCT 41.3 07/25/2022   MCV 92.8 07/25/2022   PLT 273 07/25/2022     STUDIES: No results found.  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  Oncotype score Dx 22, low risk.  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  Given her low risk Oncotype Dx score of 22, adjuvant chemotherapy was not necessary.  Patient completed adjuvant XRT on August 01, 2022.  Continue letrozole  for a total of 5 years completing in April 2029.  Return to clinic in 6 months for routine evaluation.   Bone health: Baseline bone mineral density on September 30, 2022 reported T-score of -1.0 which is considered normal.  Repeat in June 2026.   Hot flashes/joint stiffness/hair thinning: Likely related to letrozole .  After lengthy discussion with the patient she does not wish to change treatment at this time.  If her symptoms become too bothersome, she will call clinic at which time we can switch her to anastrozole.     Patient expressed understanding and was in agreement with this plan. She also understands that She can call clinic at any time with any questions, concerns, or complaints.    Cancer Staging  Invasive ductal carcinoma of right breast [facility]) Staging form: Breast, AJCC 8th Edition - Pathologic stage from 07/03/2022: Stage IA (pT1c, pN0, cM0, G1, ER+, PR+, HER2-, Oncotype DX score: 22) - Signed by Jacobo Evalene PARAS, MD on 07/03/2022 Stage prefix: Initial diagnosis Multigene prognostic tests performed: Oncotype DX Recurrence score range: Greater than or equal to 11 Histologic grading system: 3 grade system   Evalene PARAS Jacobo, MD   02/10/2024 2:33 PM

## 2024-02-10 NOTE — Progress Notes (Unsigned)
 Patient here today for follow up regarding breast cancer. Patient reports ongoing joint pain, on letrozole .

## 2024-03-31 ENCOUNTER — Encounter: Payer: Self-pay | Admitting: Oncology

## 2024-04-05 ENCOUNTER — Inpatient Hospital Stay
Admission: RE | Admit: 2024-04-05 | Discharge: 2024-04-05 | Disposition: A | Source: Ambulatory Visit | Attending: Oncology | Admitting: Oncology

## 2024-04-05 ENCOUNTER — Telehealth: Payer: Self-pay | Admitting: *Deleted

## 2024-04-05 ENCOUNTER — Other Ambulatory Visit: Payer: Self-pay | Admitting: Oncology

## 2024-04-05 ENCOUNTER — Ambulatory Visit
Admission: RE | Admit: 2024-04-05 | Discharge: 2024-04-05 | Disposition: A | Source: Ambulatory Visit | Attending: Oncology | Admitting: Oncology

## 2024-04-05 DIAGNOSIS — C50911 Malignant neoplasm of unspecified site of right female breast: Secondary | ICD-10-CM | POA: Insufficient documentation

## 2024-04-05 DIAGNOSIS — N644 Mastodynia: Secondary | ICD-10-CM

## 2024-04-05 NOTE — Telephone Encounter (Signed)
 Incoming call from [place]. Patient currently in [place]. Patient requires additional views with R u/s for breast imaging. V/o Ok with Jacobo. Order will be placed for Dr. Jacobo to [place].

## 2024-05-14 ENCOUNTER — Telehealth: Payer: Self-pay | Admitting: Oncology

## 2024-05-14 NOTE — Telephone Encounter (Signed)
 Pt lvm to cancel appt on 1/28. I called pt and confirmed the appt has been canceled.

## 2024-05-19 ENCOUNTER — Inpatient Hospital Stay: Admitting: Oncology

## 2024-09-30 ENCOUNTER — Ambulatory Visit: Admitting: Radiation Oncology
